# Patient Record
Sex: Female | Born: 1980
Health system: Southern US, Community
[De-identification: ages and names within clinical notes are randomized; demographics above are authoritative.]

## PROBLEM LIST (undated history)

## (undated) ENCOUNTER — Emergency Department (HOSPITAL_COMMUNITY): Payer: BLUE CROSS/BLUE SHIELD | Source: Home / Self Care

## (undated) DIAGNOSIS — B9689 Other specified bacterial agents as the cause of diseases classified elsewhere: Secondary | ICD-10-CM

## (undated) DIAGNOSIS — N159 Renal tubulo-interstitial disease, unspecified: Secondary | ICD-10-CM

## (undated) DIAGNOSIS — I4721 Torsades de pointes: Secondary | ICD-10-CM

## (undated) DIAGNOSIS — I471 Supraventricular tachycardia, unspecified: Secondary | ICD-10-CM

## (undated) DIAGNOSIS — N760 Acute vaginitis: Secondary | ICD-10-CM

## (undated) DIAGNOSIS — N390 Urinary tract infection, site not specified: Secondary | ICD-10-CM

---

## 2009-04-23 ENCOUNTER — Emergency Department (HOSPITAL_COMMUNITY): Admission: EM | Admit: 2009-04-23 | Discharge: 2009-04-23 | Payer: Self-pay | Admitting: Emergency Medicine

## 2011-03-09 LAB — URINALYSIS, ROUTINE W REFLEX MICROSCOPIC
Bilirubin Urine: NEGATIVE
Nitrite: NEGATIVE
Protein, ur: NEGATIVE mg/dL
Urobilinogen, UA: 0.2 mg/dL (ref 0.0–1.0)
pH: 6 (ref 5.0–8.0)

## 2011-03-09 LAB — PREGNANCY, URINE: Preg Test, Ur: NEGATIVE

## 2011-11-01 ENCOUNTER — Emergency Department (HOSPITAL_COMMUNITY)
Admission: EM | Admit: 2011-11-01 | Discharge: 2011-11-01 | Disposition: A | Discharge status: Home / Self Care | Payer: Self-pay | Attending: Emergency Medicine | Admitting: Emergency Medicine

## 2011-11-01 ENCOUNTER — Encounter: Payer: Self-pay | Admitting: *Deleted

## 2011-11-01 DIAGNOSIS — N390 Urinary tract infection, site not specified: Secondary | ICD-10-CM | POA: Insufficient documentation

## 2011-11-01 DIAGNOSIS — J329 Chronic sinusitis, unspecified: Secondary | ICD-10-CM | POA: Insufficient documentation

## 2011-11-01 LAB — URINALYSIS, ROUTINE W REFLEX MICROSCOPIC
Bilirubin Urine: NEGATIVE
Hgb urine dipstick: NEGATIVE
Ketones, ur: NEGATIVE mg/dL
Nitrite: POSITIVE — AB
Protein, ur: NEGATIVE mg/dL
Urobilinogen, UA: 0.2 mg/dL (ref 0.0–1.0)

## 2011-11-01 LAB — POCT I-STAT, CHEM 8
BUN: 10 mg/dL (ref 6–23)
Calcium, Ion: 1.22 mmol/L (ref 1.12–1.32)
Creatinine, Ser: 0.8 mg/dL (ref 0.50–1.10)
Glucose, Bld: 88 mg/dL (ref 70–99)
Hemoglobin: 15 g/dL (ref 12.0–15.0)
Sodium: 143 mEq/L (ref 135–145)
TCO2: 28 mmol/L (ref 0–100)

## 2011-11-01 LAB — URINE MICROSCOPIC-ADD ON

## 2011-11-01 MED ORDER — ACETAMINOPHEN 500 MG PO TABS
1000.0000 mg | ORAL_TABLET | Freq: Once | ORAL | Status: AC
  Administered 2011-11-01: 1000 mg via ORAL
  Filled 2011-11-01: qty 2

## 2011-11-01 MED ORDER — CEPHALEXIN 500 MG PO CAPS: 500.0000 mg | ORAL_CAPSULE | Freq: Four times a day (QID) | ORAL | Status: AC

## 2011-11-01 NOTE — ED Provider Notes (Signed)
History     CSN: [id number] Arrival date & time: 11/01/2011  2:32 PM       Chief Complaint  Patient presents with  . Dizziness   HPI Pt was seen at 1500.  Per pt, c/o gradual onset and persistence of constant frontal sinus "pressure," nasal congestion, generalized fatigue and "lightheadedness" x2 days.  Denies fevers, no CP/SOB, no cough, no rash, no sore throat, no abd pain, no N/V/D, no back pain.     History reviewed. No pertinent past medical history.  History reviewed. No pertinent past surgical history.   History  Substance Use Topics  . Smoking status: Never Smoker   . Smokeless tobacco: Not on file  . Alcohol Use: No    Review of Systems ROS: Statement: All systems negative except as marked or noted in the HPI; Constitutional: Negative for fever and chills. ; ; Eyes: Negative for eye pain, redness and discharge. ; ; ENMT: Negative for ear pain, hoarseness, and sore throat. +nasal congestion, sinus pressure; ; Cardiovascular: Negative for chest pain, palpitations, diaphoresis, dyspnea and peripheral edema. ; ; Respiratory: Negative for cough, wheezing and stridor. ; ; Gastrointestinal: Negative for nausea, vomiting, diarrhea and abdominal pain, blood in stool, hematemesis, jaundice and rectal bleeding. . ; ; Genitourinary: Negative for dysuria, flank pain and hematuria. ; ; Musculoskeletal: Negative for back pain and neck pain. Negative for swelling and trauma.; ; Skin: Negative for pruritus, rash, abrasions, blisters, bruising and skin lesion.; ; Neuro: +lightheadedness, generalized fatigue. Negative for headache, and neck stiffness. Negative for altered level of consciousness , altered mental status, extremity weakness, paresthesias, involuntary movement, seizure and syncope.     Allergies  Aspirin  Home Medications   Current Outpatient Rx  Name Route Sig Dispense Refill  . IBUPROFEN 200 MG PO TABS Oral Take 200 mg by mouth every 6 (six) hours as needed. For pain     .  LORAZEPAM 0.5 MG PO TABS Oral Take 0.25-0.5 mg by mouth every 8 (eight) hours as needed. For anxiety     . THERA M PLUS PO TABS Oral Take 1 tablet by mouth daily.        BP 142/83  Pulse 79  Temp(Src) 97.9 F (36.6 C) (Oral)  Resp 16  Ht 5\' 4"  (1.626 m)  Wt 167 lb (75.751 kg)  BMI 28.67 kg/m2  SpO2 100%  LMP 10/21/2011  Physical Exam 1505: Physical examination:  Nursing notes reviewed; Vital signs and O2 SAT reviewed;  Constitutional: Well developed, Well nourished, Well hydrated, In no acute distress; Head:  Normocephalic, atraumatic; Eyes: EOMI, PERRL, No scleral icterus; ENMT: TM's clear bilat, clear fluid levels behind TM's bilat.  +edemetous nasal turbinates bilat with clear rhinorrhea. +frontal sinus tenderness to percuss. Mouth and pharynx normal, Mucous membranes moist; Neck: Supple, Full range of motion, No lymphadenopathy; Cardiovascular: Regular rate and rhythm, No murmur, rub, or gallop; Respiratory: Breath sounds clear & equal bilaterally, No rales, rhonchi, wheezes, or rub, Normal respiratory effort/excursion; Chest: Nontender, Movement normal; Abdomen: Soft, Nontender, Nondistended, Normal bowel sounds; Genitourinary: No CVA tenderness; Extremities: Pulses normal, No tenderness, No edema, No calf edema or asymmetry.; Neuro: AA&Ox3, Major CN grossly intact. Speech clear, no facial droop.  No gross focal motor or sensory deficits in extremities.; Skin: Color normal, Warm, Dry, no rash, no petechiae.   ED Course  Procedures    MDM  MDM Reviewed: nursing note and vitals Interpretation: labs   Results for orders placed during the hospital encounter of 11/01/11  URINALYSIS, ROUTINE W REFLEX MICROSCOPIC      Component Value Range   Color, Urine YELLOW  YELLOW    APPearance CLEAR  CLEAR    Specific Gravity, Urine 1.020  1.005 - 1.030    pH 7.0  5.0 - 8.0    Glucose, UA NEGATIVE  NEGATIVE (mg/dL)   Hgb urine dipstick NEGATIVE  NEGATIVE    Bilirubin Urine NEGATIVE   NEGATIVE    Ketones, ur NEGATIVE  NEGATIVE (mg/dL)   Protein, ur NEGATIVE  NEGATIVE (mg/dL)   Urobilinogen, UA 0.2  0.0 - 1.0 (mg/dL)   Nitrite POSITIVE (*) NEGATIVE    Leukocytes, UA NEGATIVE  NEGATIVE   POCT PREGNANCY, URINE      Component Value Range   Preg Test, Ur NEGATIVE    POCT I-STAT, CHEM 8      Component Value Range   Sodium 143  135 - 145 (mEq/L)   Potassium 3.9  3.5 - 5.1 (mEq/L)   Chloride 104  96 - 112 (mEq/L)   BUN 10  6 - 23 (mg/dL)   Creatinine, Ser 4.09  0.50 - 1.10 (mg/dL)   Glucose, Bld 88  70 - 99 (mg/dL)   Calcium, Ion 8.11  9.14 - 1.32 (mmol/L)   TCO2 28  0 - 100 (mmol/L)   Hemoglobin 15.0  12.0 - 15.0 (g/dL)   HCT 78.2  95.6 - 21.3 (%)  URINE MICROSCOPIC-ADD ON      Component Value Range   Squamous Epithelial / LPF FEW (*) RARE    WBC, UA 3-6  <3 (WBC/hpf)   Bacteria, UA MANY (*) RARE      5:23 PM:  Pt not orthostatic.  VSS while in ED.  Neuro exam intact.  Wants to go home now.  Will tx for UTI.  Dx testing d/w pt and family.  Questions answered.  Verb understanding, agreeable to d/c home with outpt f/u.     Loleta Frommelt Allison Quarry, DO 11/03/11 0105

## 2011-11-01 NOTE — ED Notes (Signed)
Pt reports experiencing intermittent dizziness x 2 weeks. Pt reports dizziness becoming progressively worse since yesterday. Pt denies any nausea or vomiting. Pt states that dizziness is improved when laying down. Pt complains of headache 6/10. Breath sounds clear through out. Positive bowel sounds in all four quadrants.

## 2011-11-01 NOTE — ED Notes (Signed)
Pt states headache and dizziness began at ~1500 yesterday.  Denies N/V

## 2012-04-14 ENCOUNTER — Emergency Department (HOSPITAL_COMMUNITY)
Admission: EM | Admit: 2012-04-14 | Discharge: 2012-04-14 | Disposition: A | Discharge status: Home / Self Care | Payer: 59 | Source: Home / Self Care | Attending: Emergency Medicine | Admitting: Emergency Medicine

## 2012-04-14 ENCOUNTER — Encounter (HOSPITAL_COMMUNITY): Payer: Self-pay | Admitting: Emergency Medicine

## 2012-04-14 ENCOUNTER — Emergency Department (HOSPITAL_COMMUNITY)
Admission: EM | Admit: 2012-04-14 | Discharge: 2012-04-14 | Disposition: A | Discharge status: Home / Self Care | Payer: Self-pay | Source: Home / Self Care

## 2012-04-14 DIAGNOSIS — W57XXXA Bitten or stung by nonvenomous insect and other nonvenomous arthropods, initial encounter: Secondary | ICD-10-CM

## 2012-04-14 DIAGNOSIS — T148 Other injury of unspecified body region: Secondary | ICD-10-CM

## 2012-04-14 DIAGNOSIS — T148XXA Other injury of unspecified body region, initial encounter: Secondary | ICD-10-CM

## 2012-04-14 MED ORDER — DOXYCYCLINE HYCLATE 100 MG PO CAPS: 100.0000 mg | ORAL_CAPSULE | Freq: Two times a day (BID) | ORAL | Status: AC

## 2012-04-14 MED ORDER — DOXYCYCLINE HYCLATE 100 MG PO CAPS: 100.0000 mg | ORAL_CAPSULE | Freq: Two times a day (BID) | ORAL | Status: DC

## 2012-04-14 NOTE — ED Notes (Signed)
Pt has been bitten by 4 ticks in the past 3 days. Pt states she now has severe fatigue and slight pain at the site. Pt states she had night sweats last night and has nausea.

## 2012-04-14 NOTE — Discharge Instructions (Signed)
S. discuss if you get to develop any other symptoms that we discussed theoretically could return in about 2-3 weeks of a tests and see if you were actually supposed this illness in the meantime based on the symptoms that you have described today I consider is reasonable to  take this antibiotic

## 2012-04-14 NOTE — ED Provider Notes (Signed)
History     CSN: [id number]  Arrival date & time 04/14/12  1049   First MD Initiated Contact with Patient 04/14/12 1101      Chief Complaint  Patient presents with  . Fatigue    (Consider location/radiation/quality/duration/timing/severity/associated sxs/prior treatment) HPI Comments: Patient does describe that yesterday she was about 4-5 different tick bites and has been about 3 days cc removed them. Otherwise today she has been feeling very tired and with body aches and severe headache also experiencing tactile fevers at home and chills (patient does not have a thermometer) patient is also reporting feeling nauseous.  The history is provided by the patient.    History reviewed. No pertinent past medical history.  History reviewed. No pertinent past surgical history.  History reviewed. No pertinent family history.  History  Substance Use Topics  . Smoking status: Never Smoker   . Smokeless tobacco: Not on file  . Alcohol Use: No    OB History    Grav Para Term Preterm Abortions TAB SAB Ect Mult Living                  Review of Systems  Constitutional: Positive for fever, chills, appetite change and fatigue. Negative for activity change.  HENT: Negative for neck pain and neck stiffness.   Eyes: Negative for photophobia and redness.  Respiratory: Positive for shortness of breath. Negative for cough.   Skin: Positive for rash.  Neurological: Positive for headaches. Negative for dizziness, seizures and numbness.    Allergies  Aspirin  Home Medications   Current Outpatient Rx  Name Route Sig Dispense Refill  . IBUPROFEN 200 MG PO TABS Oral Take 200 mg by mouth every 6 (six) hours as needed. For pain     . DOXYCYCLINE HYCLATE 100 MG PO CAPS Oral Take 1 capsule (100 mg total) by mouth 2 (two) times daily. 20 capsule 0  . LORAZEPAM 0.5 MG PO TABS Oral Take 0.25-0.5 mg by mouth every 8 (eight) hours as needed. For anxiety     . THERA M PLUS PO TABS Oral Take 1  tablet by mouth daily.        BP 136/71  Pulse 77  Temp(Src) 98.9 F (37.2 C) (Oral)  Resp 16  SpO2 100%  LMP 04/03/2012  Physical Exam  Nursing note and vitals reviewed. Constitutional: She appears well-developed and well-nourished.  HENT:  Head: Normocephalic.  Eyes: Conjunctivae are normal.  Neck: Neck supple.  Pulmonary/Chest: Effort normal.  Skin: Rash noted.       ED Course  Procedures (including critical care time)  Labs Reviewed - No data to display No results found.   1. Tick bite       MDM   Patient with a recent history of multiple tick bites with multiple symptoms. Patient is reporting tactile fevers, headaches body aches nausea. He describes a patient that was unlikely but given the metallic the rate of this potential infection it was reasonable to start her on doxycycline. Have encouraged her to return if worsening symptoms or in about 2 weeks if symptoms resolve if she wanted to be tested for several logic all compression. Has exposure have occurred about 3-4 days with discuss are probably testing her today would have been medically necessary       Debra Molly, MD 04/14/12 1203

## 2012-08-05 ENCOUNTER — Encounter (HOSPITAL_COMMUNITY): Payer: Self-pay | Admitting: *Deleted

## 2012-08-05 DIAGNOSIS — Z888 Allergy status to other drugs, medicaments and biological substances status: Secondary | ICD-10-CM | POA: Insufficient documentation

## 2012-08-05 DIAGNOSIS — N3090 Cystitis, unspecified without hematuria: Secondary | ICD-10-CM | POA: Insufficient documentation

## 2012-08-05 NOTE — ED Notes (Signed)
Pt states that she has a history of kidney infections. Pt states that she has been having pressure when she pees, but states she came in tonight due to having blood in her urine. Pt states she took an old cipro from her last kidney infection. Pt states pain does travel to her right side and back. Pt alert and oriented, pt ambulatory.

## 2012-08-06 ENCOUNTER — Emergency Department (HOSPITAL_COMMUNITY)
Admission: EM | Admit: 2012-08-06 | Discharge: 2012-08-06 | Disposition: A | Discharge status: Home / Self Care | Payer: 59 | Attending: Emergency Medicine | Admitting: Emergency Medicine

## 2012-08-06 DIAGNOSIS — N3090 Cystitis, unspecified without hematuria: Secondary | ICD-10-CM

## 2012-08-06 HISTORY — DX: Renal tubulo-interstitial disease, unspecified: N15.9

## 2012-08-06 LAB — CBC WITH DIFFERENTIAL/PLATELET
Basophils Absolute: 0 10*3/uL (ref 0.0–0.1)
Basophils Relative: 0 % (ref 0–1)
Eosinophils Relative: 2 % (ref 0–5)
HCT: 40.2 % (ref 36.0–46.0)
MCH: 31.1 pg (ref 26.0–34.0)
MCHC: 34.1 g/dL (ref 30.0–36.0)
MCV: 91.4 fL (ref 78.0–100.0)
Monocytes Absolute: 0.5 10*3/uL (ref 0.1–1.0)
Monocytes Relative: 8 % (ref 3–12)
RDW: 12.3 % (ref 11.5–15.5)

## 2012-08-06 LAB — URINE MICROSCOPIC-ADD ON

## 2012-08-06 LAB — URINALYSIS, ROUTINE W REFLEX MICROSCOPIC
Bilirubin Urine: NEGATIVE
Glucose, UA: NEGATIVE mg/dL
Ketones, ur: NEGATIVE mg/dL
Protein, ur: 30 mg/dL — AB

## 2012-08-06 LAB — COMPREHENSIVE METABOLIC PANEL
AST: 25 U/L (ref 0–37)
Albumin: 4.2 g/dL (ref 3.5–5.2)
BUN: 15 mg/dL (ref 6–23)
Calcium: 9.5 mg/dL (ref 8.4–10.5)
Creatinine, Ser: 0.82 mg/dL (ref 0.50–1.10)
GFR calc Af Amer: >90 mL/min (ref >90)
GFR calc non Af Amer: >90 mL/min (ref >90)

## 2012-08-06 MED ORDER — OXYCODONE-ACETAMINOPHEN 5-325 MG PO TABS
1.0000 | ORAL_TABLET | Freq: Once | ORAL | Status: AC
  Administered 2012-08-06: 1 via ORAL
  Filled 2012-08-06: qty 1

## 2012-08-06 MED ORDER — SULFAMETHOXAZOLE-TMP DS 800-160 MG PO TABS
1.0000 | ORAL_TABLET | Freq: Once | ORAL | Status: AC
  Administered 2012-08-06: 1 via ORAL
  Filled 2012-08-06: qty 1

## 2012-08-06 MED ORDER — SULFAMETHOXAZOLE-TRIMETHOPRIM 800-160 MG PO TABS: 1.0000 | ORAL_TABLET | Freq: Two times a day (BID) | ORAL | Status: AC

## 2012-08-06 MED ORDER — PHENAZOPYRIDINE HCL 200 MG PO TABS: 200.0000 mg | ORAL_TABLET | Freq: Three times a day (TID) | ORAL | Status: AC | PRN

## 2012-08-06 NOTE — ED Notes (Signed)
Prescriptions given x2 with discharge papers.

## 2012-08-06 NOTE — ED Provider Notes (Addendum)
History     CSN: [id number]  Arrival date & time 08/05/12  2319   First MD Initiated Contact with Patient 08/06/12 0155      Chief Complaint  Patient presents with  . Hematuria    (Consider location/radiation/quality/duration/timing/severity/associated sxs/prior treatment) Patient is a 31 y.o. female presenting with hematuria and dysuria. The history is provided by the patient. No language interpreter was used.  Hematuria This is a new problem. The current episode started yesterday. The problem is unchanged. She describes the hematuria as gross hematuria. The hematuria occurs throughout her entire urinary stream. She reports no clotting in her urine stream. Her pain is at a severity of 8/10. The pain is severe. She describes her urine color as light pink. Irritative symptoms include frequency and urgency. Obstructive symptoms include a slower stream. Associated symptoms include dysuria and hesitancy. Pertinent negatives include no facial swelling, fever, flank pain, inability to urinate, nausea or vomiting. Her sexual activity is non-contributory to the current illness. Her past medical history is significant for hypertension. There is no history of kidney stones.  Dysuria  This is a recurrent problem. The current episode started yesterday. The problem occurs every urination. The problem has not changed since onset.The quality of the pain is described as burning. The pain is at a severity of 8/10. The pain is severe. There has been no fever. There is no history of pyelonephritis. Associated symptoms include frequency, hematuria, hesitancy and urgency. Pertinent negatives include no nausea, no vomiting and no flank pain. She has tried nothing for the symptoms. Her past medical history is significant for recurrent UTIs. Her past medical history does not include kidney stones.    Past Medical History  Diagnosis Date  . Kidney infection     History reviewed. No pertinent past surgical  history.  History reviewed. No pertinent family history.  History  Substance Use Topics  . Smoking status: Never Smoker   . Smokeless tobacco: Not on file  . Alcohol Use: Yes    OB History    Grav Para Term Preterm Abortions TAB SAB Ect Mult Living                  Review of Systems  Constitutional: Negative for fever.  HENT: Negative for facial swelling.   Gastrointestinal: Negative for nausea and vomiting.  Genitourinary: Positive for dysuria, hesitancy, urgency, frequency and hematuria. Negative for flank pain.  All other systems reviewed and are negative.    Allergies  Aspirin  Home Medications   Current Outpatient Rx  Name Route Sig Dispense Refill  . CIPROFLOXACIN HCL 500 MG PO TABS Oral Take 250 mg by mouth once.    Marland Kitchen DIPHENHYDRAMINE HCL 25 MG PO CAPS Oral Take 25 mg by mouth at bedtime as needed. sleep      BP 123/74  Pulse 78  Temp 98 F (36.7 C) (Oral)  Resp 20  SpO2 97%  Physical Exam  Constitutional: She is oriented to person, place, and time. She appears well-developed and well-nourished. No distress.  HENT:  Head: Normocephalic and atraumatic.  Mouth/Throat: Oropharynx is clear and moist.  Eyes: Conjunctivae are normal. Pupils are equal, round, and reactive to light.  Neck: Normal range of motion. Neck supple.  Cardiovascular: Normal rate and regular rhythm.   Pulmonary/Chest: Effort normal and breath sounds normal. She has no wheezes. She has no rales.  Abdominal: Soft. Bowel sounds are normal. There is no tenderness. There is no rebound and no guarding.  Musculoskeletal: Normal  range of motion.  Neurological: She is alert and oriented to person, place, and time.  Skin: Skin is warm.  Psychiatric: She has a normal mood and affect.    ED Course  Procedures (including critical care time)  Labs Reviewed  URINALYSIS, ROUTINE W REFLEX MICROSCOPIC - Abnormal; Notable for the following:    APPearance CLOUDY (*)     Hgb urine dipstick LARGE (*)      Protein, ur 30 (*)     Leukocytes, UA MODERATE (*)     All other components within normal limits  URINE MICROSCOPIC-ADD ON - Abnormal; Notable for the following:    Squamous Epithelial / LPF FEW (*)     All other components within normal limits  CBC WITH DIFFERENTIAL  COMPREHENSIVE METABOLIC PANEL  POCT PREGNANCY, URINE   No results found.   No diagnosis found.    MDM  No flank pain.  No vaginal complaints.  Symptoms consistent with cystitis.  Patient states sulf has worked in the past.  Return for fevers flank pain clotting nausea vomiting or any concerns.         Jasmine Awe, MD 08/06/12 0216  Jayant Kriz K Kaulin Chaves-Rasch, MD 08/06/12 [phone]

## 2012-12-24 ENCOUNTER — Emergency Department (HOSPITAL_COMMUNITY)
Admission: EM | Admit: 2012-12-24 | Discharge: 2012-12-24 | Disposition: A | Discharge status: Home / Self Care | Payer: 59 | Source: Home / Self Care

## 2012-12-24 ENCOUNTER — Emergency Department (HOSPITAL_COMMUNITY): Payer: 59

## 2012-12-24 ENCOUNTER — Other Ambulatory Visit (HOSPITAL_COMMUNITY)
Admission: RE | Admit: 2012-12-24 | Discharge: 2012-12-24 | Disposition: A | Discharge status: Home / Self Care | Payer: 59 | Source: Ambulatory Visit | Attending: Family Medicine | Admitting: Family Medicine

## 2012-12-24 ENCOUNTER — Emergency Department (HOSPITAL_COMMUNITY)
Admission: EM | Admit: 2012-12-24 | Discharge: 2012-12-24 | Disposition: A | Discharge status: Home / Self Care | Payer: 59 | Attending: Emergency Medicine | Admitting: Emergency Medicine

## 2012-12-24 ENCOUNTER — Encounter (HOSPITAL_COMMUNITY): Payer: Self-pay | Admitting: *Deleted

## 2012-12-24 ENCOUNTER — Encounter (HOSPITAL_COMMUNITY): Payer: Self-pay

## 2012-12-24 DIAGNOSIS — N72 Inflammatory disease of cervix uteri: Secondary | ICD-10-CM

## 2012-12-24 DIAGNOSIS — N760 Acute vaginitis: Secondary | ICD-10-CM

## 2012-12-24 DIAGNOSIS — R209 Unspecified disturbances of skin sensation: Secondary | ICD-10-CM | POA: Insufficient documentation

## 2012-12-24 DIAGNOSIS — R51 Headache: Secondary | ICD-10-CM | POA: Insufficient documentation

## 2012-12-24 DIAGNOSIS — Z113 Encounter for screening for infections with a predominantly sexual mode of transmission: Secondary | ICD-10-CM | POA: Insufficient documentation

## 2012-12-24 DIAGNOSIS — A499 Bacterial infection, unspecified: Secondary | ICD-10-CM

## 2012-12-24 DIAGNOSIS — R202 Paresthesia of skin: Secondary | ICD-10-CM

## 2012-12-24 DIAGNOSIS — N390 Urinary tract infection, site not specified: Secondary | ICD-10-CM | POA: Insufficient documentation

## 2012-12-24 DIAGNOSIS — R0789 Other chest pain: Secondary | ICD-10-CM | POA: Insufficient documentation

## 2012-12-24 LAB — COMPREHENSIVE METABOLIC PANEL
AST: 23 U/L (ref 0–37)
Albumin: 4.3 g/dL (ref 3.5–5.2)
Calcium: 9.4 mg/dL (ref 8.4–10.5)
Creatinine, Ser: 0.69 mg/dL (ref 0.50–1.10)
GFR calc non Af Amer: >90 mL/min (ref >90)

## 2012-12-24 LAB — CBC WITH DIFFERENTIAL/PLATELET
Basophils Absolute: 0 10*3/uL (ref 0.0–0.1)
Basophils Relative: 0 % (ref 0–1)
Eosinophils Relative: 0 % (ref 0–5)
HCT: 41 % (ref 36.0–46.0)
MCHC: 34.6 g/dL (ref 30.0–36.0)
MCV: 90.1 fL (ref 78.0–100.0)
Monocytes Absolute: 0.5 10*3/uL (ref 0.1–1.0)
RDW: 12.3 % (ref 11.5–15.5)

## 2012-12-24 LAB — POCT URINALYSIS DIP (DEVICE)
Ketones, ur: 15 mg/dL — AB
Leukocytes, UA: NEGATIVE
pH: 7 (ref 5.0–8.0)

## 2012-12-24 MED ORDER — CEPHALEXIN 250 MG PO CAPS: 250.0000 mg | ORAL_CAPSULE | Freq: Four times a day (QID) | ORAL | Status: DC

## 2012-12-24 MED ORDER — METRONIDAZOLE 500 MG PO TABS: 500.0000 mg | ORAL_TABLET | Freq: Two times a day (BID) | ORAL | Status: DC

## 2012-12-24 MED ORDER — IBUPROFEN 800 MG PO TABS
800.0000 mg | ORAL_TABLET | Freq: Once | ORAL | Status: AC
  Administered 2012-12-24: 800 mg via ORAL
  Filled 2012-12-24: qty 1

## 2012-12-24 MED ORDER — ACETAMINOPHEN 325 MG PO TABS
650.0000 mg | ORAL_TABLET | Freq: Once | ORAL | Status: AC
  Administered 2012-12-24: 650 mg via ORAL
  Filled 2012-12-24: qty 2

## 2012-12-24 NOTE — ED Notes (Signed)
Pt c/o severe headache and body feeling numb.  Dr. Bernette Mayers notified.  Delay explained to pt.

## 2012-12-24 NOTE — ED Notes (Signed)
Patient complains of dysuria; burning sensation; polyuria with odor to urine x 5 days; also lower back pain x 1 day. Denies nausea, vomiting, fever/chills, diarrhea.

## 2012-12-24 NOTE — ED Provider Notes (Signed)
Medical screening examination/treatment/procedure(s) were performed by resident physician or non-physician practitioner and as supervising physician I was immediately available for consultation/collaboration.   Jakhari Space DOUGLAS MD.    Ankita Newcomer D Shem Plemmons, MD 12/24/12 1631 

## 2012-12-24 NOTE — ED Notes (Signed)
Pt complaining of facial numbness & numbness to both arms. Pt remains on cardiac monitor, VS WNL. Pt states the right side of her head also is hurting.

## 2012-12-24 NOTE — ED Provider Notes (Signed)
History   This chart was scribed for Flint Melter, MD by Leone Payor, ED Scribe. This patient was seen in room [facility]02/[facility]02 and the patient's care was started 7:03 PM.  CSN: [id number]  Arrival date & time 12/24/12  1656   First MD Initiated Contact with Patient 12/24/12 1905      Chief Complaint  Patient presents with  . Panic Attack     The history is provided by the patient. No language interpreter was used.    Debra Ramos is a 32 y.o. female brought in by ambulance, who presents to the Emergency Department complaining of constant, unchanged facial numbness and numbness to both arms and legs starting today. Pt has associated headache. She states she had chest pain in the ambulance but does not currently. Pt was diagnosed with UTI and Bacterial Vaginosis at Urgent Care earlier today. Pt states she has not been eating properly in the last several days due to family problems. She denies chest pain currently, trouble breathing.   Allergic to aspirin.  Pt denies smoking but occasionally uses alcohol.  Past Medical History  Diagnosis Date  . Kidney infection     History reviewed. No pertinent past surgical history.  No family history on file.  History  Substance Use Topics  . Smoking status: Never Smoker   . Smokeless tobacco: Not on file  . Alcohol Use: Yes    No OB history provided.   Review of Systems  A complete 10 system review of systems was obtained and all systems are negative except as noted in the HPI and PMH.    Allergies  Aspirin  Home Medications   Current Outpatient Rx  Name  Route  Sig  Dispense  Refill  . CEPHALEXIN 250 MG PO CAPS   Oral   Take 1 capsule (250 mg total) by mouth 4 (four) times daily.   28 capsule   0   . METRONIDAZOLE 500 MG PO TABS   Oral   Take 1 tablet (500 mg total) by mouth 2 (two) times daily. X 7 days   14 tablet   0     BP 128/91  Pulse 101  Temp 98.8 F (37.1 C) (Oral)  Resp 20  Ht 5\' 4"  (1.626 m)  Wt 175  lb (79.379 kg)  BMI 30.04 kg/m2  SpO2 100%  LMP 12/05/2012  Physical Exam  Nursing note and vitals reviewed. Constitutional: She is oriented to person, place, and time. She appears well-developed and well-nourished.  HENT:  Head: Normocephalic and atraumatic.  Eyes: Conjunctivae normal and EOM are normal. Pupils are equal, round, and reactive to light.  Neck: Normal range of motion and phonation normal. Neck supple.       No meningismus.   Cardiovascular: Normal rate, regular rhythm and intact distal pulses.   Pulmonary/Chest: Effort normal and breath sounds normal. She exhibits no tenderness.  Abdominal: Soft. She exhibits no distension. There is no tenderness. There is no guarding.  Musculoskeletal: Normal range of motion.  Neurological: She is alert and oriented to person, place, and time. She has normal strength. She exhibits normal muscle tone.  Skin: Skin is warm and dry.  Psychiatric: She has a normal mood and affect. Her behavior is normal. Judgment and thought content normal.    ED Course  Procedures (including critical care time)  DIAGNOSTIC STUDIES: Oxygen Saturation is 100% on room air, normal by my interpretation.    COORDINATION OF CARE:  7:12 PM Discussed treatment plan  which includes CXR, CT scan of head, CBC panel, comprehensive metabolic panel, troponin I with pt at bedside and pt agreed to plan.    8:56 PM Advised pt of radiology and lab work results. Discussed discharge plan with pt and pt agreed to plan. Also advised pt to follow up and pt agreed.       Date: 09/15/2012  Rate: 78  Rhythm: normal sinus rhythm and sinus arrhythmia  QRS Axis: normal  PR and QT Intervals: normal  ST/T Wave abnormalities: normal  PR and QRS Conduction Disutrbances:none  Narrative Interpretation:   Old EKG Reviewed: none available     Results for orders placed during the hospital encounter of 12/24/12  CBC WITH DIFFERENTIAL      Component Value Range   WBC 6.2  4.0  - 10.5 K/uL   RBC 4.55  3.87 - 5.11 MIL/uL   Hemoglobin 14.2  12.0 - 15.0 g/dL   HCT 16.1  09.6 - 04.5 %   MCV 90.1  78.0 - 100.0 fL   MCH 31.2  26.0 - 34.0 pg   MCHC 34.6  30.0 - 36.0 g/dL   RDW 40.9  81.1 - 91.4 %   Platelets 217  150 - 400 K/uL   Neutrophils Relative 73  43 - 77 %   Neutro Abs 4.5  1.7 - 7.7 K/uL   Lymphocytes Relative 19  12 - 46 %   Lymphs Abs 1.2  0.7 - 4.0 K/uL   Monocytes Relative 8  3 - 12 %   Monocytes Absolute 0.5  0.1 - 1.0 K/uL   Eosinophils Relative 0  0 - 5 %   Eosinophils Absolute 0.0  0.0 - 0.7 K/uL   Basophils Relative 0  0 - 1 %   Basophils Absolute 0.0  0.0 - 0.1 K/uL  COMPREHENSIVE METABOLIC PANEL      Component Value Range   Sodium 135  135 - 145 mEq/L   Potassium 3.5  3.5 - 5.1 mEq/L   Chloride 100  96 - 112 mEq/L   CO2 25  19 - 32 mEq/L   Glucose, Bld 90  70 - 99 mg/dL   BUN 12  6 - 23 mg/dL   Creatinine, Ser 7.82  0.50 - 1.10 mg/dL   Calcium 9.4  8.4 - 95.6 mg/dL   Total Protein 7.4  6.0 - 8.3 g/dL   Albumin 4.3  3.5 - 5.2 g/dL   AST 23  0 - 37 U/L   ALT 18  0 - 35 U/L   Alkaline Phosphatase 67  39 - 117 U/L   Total Bilirubin 0.6  0.3 - 1.2 mg/dL   GFR calc non Af Amer >90  >90 mL/min   GFR calc Af Amer >90  >90 mL/min  TROPONIN I      Component Value Range   Troponin I <0.30  <0.30 ng/mL   Dg Chest 2 View  12/24/2012  *RADIOLOGY REPORT*  Clinical Data: Dizziness.  Weakness.  Numbness.  CHEST - 2 VIEW  Comparison: None.  Findings: Cardiac and mediastinal contours appear normal.  The lungs appear clear.  No pleural effusion is identified.  IMPRESSION:  No significant abnormality identified.   Original Report Authenticated By: Gaylyn Rong, M.D.    Ct Head Wo Contrast  12/24/2012  *RADIOLOGY REPORT*  Clinical Data: Facial numbness.  Panic attack.  CT HEAD WITHOUT CONTRAST  Technique:  Contiguous axial images were obtained from the base of the skull through the vertex  without contrast.  Comparison: None.  Findings: The brain  appears normal without infarct, hemorrhage, mass lesion, mass effect, midline shift or abnormal extra-axial fluid collection.  No pneumocephalus or hydrocephalus.  Calvarium intact.  Small mucous retention cyst or polyp right maxillary sinus noted.  IMPRESSION: No acute finding.   Original Report Authenticated By: Holley Dexter, M.D.     Nursing notes, applicable records and vitals reviewed.  Radiologic Images/Reports reviewed.    1. Paresthesia   2. Headache       MDM  Nonspecific, paresthesias, headache, and visual symptoms. Doubt CVA, meningitis, encephalitis. She has significant stress in her family and may be having a emotional reaction. Doubt metabolic instability, serious bacterial infection or impending vascular collapse; the patient is stable for discharge.      I personally performed the services described in this documentation, which was scribed in my presence. The recorded information has been reviewed and is accurate.      Plan: Home Medications- take medicines, as prescribed, earlier today; Home Treatments- rest; Recommended follow up- PCP, when necessary    Flint Melter, MD 12/25/12 [phone]

## 2012-12-24 NOTE — ED Notes (Signed)
Pt alert & oriented x4. Parent given discharge instructions, paperwork & prescription(s). Patient instructed to stop at the registration desk to finish any additional paperwork. Patient verbalized understanding. Pt left department w/ no further questions.

## 2012-12-24 NOTE — ED Notes (Addendum)
Pt via EMS called for Respiratory distress. C/o chest pressure non-radiating. Pt just discharged from urgent care with diagnoses of UTI, and Bacterial Vaginosis. Pt received call from mother stating she wanted to kill herself then the symptoms started. Denies Chest pain at present but does have right sided headache.

## 2012-12-24 NOTE — ED Provider Notes (Signed)
History     CSN: [id number]  Arrival date & time 12/24/12  1145   First MD Initiated Contact with Patient 12/24/12 1401      Chief Complaint  Patient presents with  . Urinary Tract Infection    (Consider location/radiation/quality/duration/timing/severity/associated sxs/prior treatment) HPI Comments: 32 year old female states she has had 5 days of dysuria. She has been aching across her lower pelvis particularly in the supra pubic area. Is also having mild bilateral back pain and urinary frequency. She denies fevers at home and bleeding. Initially she denied having a vaginal discharge but later states she does have an abnormal discharge along with a foul odor. She states the last time she had the symptoms she also had BV associated with her UTI. LMP 12/05/12. For the end of the exam the patient tells me that this month she has not been using any contraception and if she gets pregnant then that is okay with her family planning.   Past Medical History  Diagnosis Date  . Kidney infection     History reviewed. No pertinent past surgical history.  No family history on file.  History  Substance Use Topics  . Smoking status: Never Smoker   . Smokeless tobacco: Not on file  . Alcohol Use: Yes    OB History    Grav Para Term Preterm Abortions TAB SAB Ect Mult Living                  Review of Systems  Constitutional: Negative.   Respiratory: Negative.   Cardiovascular: Negative.   Gastrointestinal: Negative.   Genitourinary: Positive for dysuria, frequency, flank pain, vaginal discharge and pelvic pain. Negative for vaginal bleeding, difficulty urinating and menstrual problem.  Musculoskeletal: Negative.   Psychiatric/Behavioral: Negative.     Allergies  Aspirin  Home Medications   Current Outpatient Rx  Name  Route  Sig  Dispense  Refill  . CEPHALEXIN 250 MG PO CAPS   Oral   Take 1 capsule (250 mg total) by mouth 4 (four) times daily.   28 capsule   0   .  CIPROFLOXACIN HCL 500 MG PO TABS   Oral   Take 250 mg by mouth once.         Marland Kitchen DIPHENHYDRAMINE HCL 25 MG PO CAPS   Oral   Take 25 mg by mouth at bedtime as needed. sleep         . METRONIDAZOLE 500 MG PO TABS   Oral   Take 1 tablet (500 mg total) by mouth 2 (two) times daily. X 7 days   14 tablet   0     BP 124/81  Pulse 83  Temp 99.1 F (37.3 C) (Oral)  Resp 18  SpO2 100%  LMP 12/05/2012  Physical Exam  Nursing note and vitals reviewed. Constitutional: She is oriented to person, place, and time. She appears well-developed and well-nourished. No distress.  HENT:  Head: Normocephalic and atraumatic.  Mouth/Throat: No oropharyngeal exudate.  Eyes: EOM are normal. Pupils are equal, round, and reactive to light.  Neck: Normal range of motion. Neck supple.  Cardiovascular: Normal rate and normal heart sounds.   Pulmonary/Chest: Effort normal and breath sounds normal. No respiratory distress.  Abdominal: Soft. Bowel sounds are normal. She exhibits no mass. There is no tenderness. There is no rebound and no guarding.  Genitourinary:       External pelvic exam reveals tenderness over the suprapubis. There is no bilateral pelvic tenderness. Normal external female genitalia  and without lesions. Vagina and vaginal vault with copious amount of thin pale-like green to white bubbly discharge. This also notes the cervix. Cervix is midline and mildly erythemic. Bimanual positive for CMT and adnexal tenderness.  Musculoskeletal: Normal range of motion.  Neurological: She is alert and oriented to person, place, and time. No cranial nerve deficit.  Skin: Skin is warm and dry.  Psychiatric: She has a normal mood and affect.    ED Course  Procedures (including critical care time)  Labs Reviewed  POCT URINALYSIS DIP (DEVICE) - Abnormal; Notable for the following:    Bilirubin Urine SMALL (*)     Ketones, ur 15 (*)     Protein, ur 30 (*)     All other components within normal  limits  URINE CULTURE  CERVICOVAGINAL ANCILLARY ONLY   No results found.   1. UTI (lower urinary tract infection)   2. Vaginitis   3. Cervicitis   4. BV (bacterial vaginosis)       MDM  Although the urinalysis was negative for nitrites and leukocytes her symptoms were typical of urinary tract infection. In addition she has a vaginal discharge consistent with BV or other vaginitis as well as mild pelvic pain. We will treat her for UTI and BV with the following Keflex 250 mg 4 times a day for 7 days Metronidazole 500 mg twice a day for 7 days Affirm chest were obtained. For any positives that really receive will call and treat accordingly. Any worsening new symptoms or problems may return. Drink plenty of fluids and stay well-hydrated Culture the urine        [name], NP 12/24/12 1436  [name], NP 12/24/12 1438

## 2012-12-25 ENCOUNTER — Encounter (HOSPITAL_COMMUNITY): Payer: Self-pay | Admitting: Emergency Medicine

## 2012-12-25 LAB — POCT PREGNANCY, URINE: Preg Test, Ur: NEGATIVE

## 2012-12-27 LAB — URINE CULTURE

## 2013-01-12 NOTE — ED Notes (Signed)
GC/Chlamydia neg., Affirm: all neg., Urine culture: >100,000 colonies Staph (coagulase neg.)  Pt. treated with Keflex. Vassie Moselle 01/12/2013

## 2013-01-22 ENCOUNTER — Telehealth (HOSPITAL_COMMUNITY): Payer: Self-pay | Admitting: *Deleted

## 2013-01-22 NOTE — ED Notes (Signed)
[name] NP wanted me to call pt. for clinical improvement. I called but voicemail box full and unable to leave a message. Debra Ramos 01/22/2013

## 2013-01-23 ENCOUNTER — Telehealth (HOSPITAL_COMMUNITY): Payer: Self-pay | Admitting: *Deleted

## 2013-01-26 ENCOUNTER — Telehealth (HOSPITAL_COMMUNITY): Payer: Self-pay | Admitting: *Deleted

## 2013-01-26 NOTE — ED Notes (Signed)
Unable to contact pt. by phone.  Letter sent asking pt. to call. Debra Ramos 01/26/2013

## 2013-04-02 ENCOUNTER — Encounter (HOSPITAL_COMMUNITY): Payer: Self-pay

## 2013-04-02 ENCOUNTER — Emergency Department (HOSPITAL_COMMUNITY)
Admission: EM | Admit: 2013-04-02 | Discharge: 2013-04-03 | Disposition: A | Discharge status: Home / Self Care | Payer: 59 | Attending: Emergency Medicine | Admitting: Emergency Medicine

## 2013-04-02 DIAGNOSIS — R209 Unspecified disturbances of skin sensation: Secondary | ICD-10-CM | POA: Insufficient documentation

## 2013-04-02 DIAGNOSIS — R300 Dysuria: Secondary | ICD-10-CM | POA: Insufficient documentation

## 2013-04-02 DIAGNOSIS — K921 Melena: Secondary | ICD-10-CM | POA: Insufficient documentation

## 2013-04-02 DIAGNOSIS — Z79899 Other long term (current) drug therapy: Secondary | ICD-10-CM | POA: Insufficient documentation

## 2013-04-02 DIAGNOSIS — R350 Frequency of micturition: Secondary | ICD-10-CM | POA: Insufficient documentation

## 2013-04-02 DIAGNOSIS — Z8742 Personal history of other diseases of the female genital tract: Secondary | ICD-10-CM | POA: Insufficient documentation

## 2013-04-02 DIAGNOSIS — Z87448 Personal history of other diseases of urinary system: Secondary | ICD-10-CM | POA: Insufficient documentation

## 2013-04-02 DIAGNOSIS — N898 Other specified noninflammatory disorders of vagina: Secondary | ICD-10-CM | POA: Insufficient documentation

## 2013-04-02 DIAGNOSIS — N949 Unspecified condition associated with female genital organs and menstrual cycle: Secondary | ICD-10-CM | POA: Insufficient documentation

## 2013-04-02 DIAGNOSIS — N390 Urinary tract infection, site not specified: Secondary | ICD-10-CM | POA: Insufficient documentation

## 2013-04-02 DIAGNOSIS — R109 Unspecified abdominal pain: Secondary | ICD-10-CM | POA: Insufficient documentation

## 2013-04-02 DIAGNOSIS — Z3202 Encounter for pregnancy test, result negative: Secondary | ICD-10-CM | POA: Insufficient documentation

## 2013-04-02 HISTORY — DX: Urinary tract infection, site not specified: N39.0

## 2013-04-02 HISTORY — DX: Other specified bacterial agents as the cause of diseases classified elsewhere: B96.89

## 2013-04-02 HISTORY — DX: Acute vaginitis: N76.0

## 2013-04-02 LAB — URINE MICROSCOPIC-ADD ON

## 2013-04-02 LAB — POCT PREGNANCY, URINE: Preg Test, Ur: NEGATIVE

## 2013-04-02 LAB — URINALYSIS, ROUTINE W REFLEX MICROSCOPIC
Specific Gravity, Urine: 1.028 (ref 1.005–1.030)
Urobilinogen, UA: 0.2 mg/dL (ref 0.0–1.0)
pH: 6.5 (ref 5.0–8.0)

## 2013-04-02 NOTE — ED Notes (Signed)
Pt also c/o swollen and tender lymph node under her left axilla.

## 2013-04-02 NOTE — ED Notes (Signed)
Pt states was treated for BV at urgent care 1 month ago-states she is having burning on urination since last Friday.  States also having a vaginal discharge.  Pt states her partner was not treated and states she did have unprotected sex with him again.

## 2013-04-02 NOTE — ED Provider Notes (Signed)
History     CSN: [id number]  Arrival date & time 04/02/13  1959   First MD Initiated Contact with Patient 04/02/13 2318      Chief Complaint  Patient presents with  . Urinary Tract Infection    (Consider location/radiation/quality/duration/timing/severity/associated sxs/prior treatment) HPI Comments: Patient is a 32 year old female who presents for dysuria that has been constant and gradually worsening over the last 10 days. Patient also admits to associated pelvic discomfort that is pressure like and non-radiating and white vaginal d/c. Patient denies any aggravating or alleviating factors of her symptoms. She further denies fever, CP, SOB, abdominal pain, N/V/D, hematuria, urinary frequency, melena/hematochezia, and numbness or tingling in her extremities.  Patient is a 32 y.o. female presenting with urinary tract infection. The history is provided by the patient. No language interpreter was used.  Urinary Tract Infection Pertinent negatives include no chest pain, fever, nausea, rash or vomiting.    Past Medical History  Diagnosis Date  . Kidney infection   . Bacterial vaginal infection   . UTI (lower urinary tract infection)     History reviewed. No pertinent past surgical history.  No family history on file.  History  Substance Use Topics  . Smoking status: Never Smoker   . Smokeless tobacco: Not on file  . Alcohol Use: Yes     Comment: on occasion    OB History   Grav Para Term Preterm Abortions TAB SAB Ect Mult Living                  Review of Systems  Constitutional: Negative for fever.  Respiratory: Negative for shortness of breath.   Cardiovascular: Negative for chest pain.  Gastrointestinal: Negative for nausea and vomiting.  Genitourinary: Positive for dysuria, vaginal discharge and pelvic pain. Negative for hematuria, flank pain, vaginal bleeding and difficulty urinating.  Skin: Negative for color change, pallor and rash.  All other systems reviewed  and are negative.    Allergies  Aspirin and Aspirin  Home Medications   Current Outpatient Rx  Name  Route  Sig  Dispense  Refill  . LORazepam (ATIVAN) 0.5 MG tablet   Oral   Take 0.25-0.5 mg by mouth every 8 (eight) hours as needed. For anxiety          . sulfamethoxazole-trimethoprim (BACTRIM DS,SEPTRA DS) 800-160 MG per tablet   Oral   Take 1 tablet by mouth 2 (two) times daily.   14 tablet   0     BP 143/89  Pulse 86  Temp(Src) 99 F (37.2 C) (Oral)  Resp 21  Ht 5\' 4"  (1.626 m)  Wt 178 lb 8 oz (80.967 kg)  BMI 30.62 kg/m2  SpO2 97%  LMP 03/22/2013  Physical Exam  Nursing note and vitals reviewed. Constitutional: She appears well-developed and well-nourished.  HENT:  Head: Normocephalic and atraumatic.  Mouth/Throat: Oropharynx is clear and moist. No oropharyngeal exudate.  Eyes: Conjunctivae and EOM are normal. Pupils are equal, round, and reactive to light. No scleral icterus.  Neck: Normal range of motion. Neck supple.  Cardiovascular: Normal rate, regular rhythm, normal heart sounds and intact distal pulses.   Pulmonary/Chest: Effort normal and breath sounds normal. No respiratory distress. She has no wheezes. She has no rales.  Abdominal: Soft. She exhibits no distension and no mass. There is tenderness in the suprapubic area. There is no rebound, no guarding, no CVA tenderness, no tenderness at McBurney's point and negative Murphy's sign.    Genitourinary: Uterus normal. There  is no rash, tenderness, lesion or injury on the right labia. There is no rash, tenderness, lesion or injury on the left labia. Cervix exhibits no motion tenderness, no discharge and no friability. Right adnexum displays no mass, no tenderness and no fullness. Left adnexum displays no mass, no tenderness and no fullness. No erythema, tenderness or bleeding around the vagina. No foreign body around the vagina. No signs of injury around the vagina. Vaginal discharge found.  White milky  d/c in vaginal vault; no d/c from cervical os appreciated.  Musculoskeletal: Normal range of motion.  Lymphadenopathy:    She has no cervical adenopathy.  Neurological: She is alert.  Skin: Skin is warm and dry. No rash noted. No erythema.  Psychiatric: She has a normal mood and affect. Her behavior is normal.    ED Course  Procedures (including critical care time)  Labs Reviewed  WET PREP, GENITAL - Abnormal; Notable for the following:    WBC, Wet Prep HPF POC FEW (*)    All other components within normal limits  URINALYSIS, ROUTINE W REFLEX MICROSCOPIC - Abnormal; Notable for the following:    APPearance CLOUDY (*)    Hgb urine dipstick TRACE (*)    Leukocytes, UA MODERATE (*)    All other components within normal limits  URINE MICROSCOPIC-ADD ON - Abnormal; Notable for the following:    Bacteria, UA MANY (*)    All other components within normal limits  URINE CULTURE  GC/CHLAMYDIA PROBE AMP  POCT PREGNANCY, URINE   No results found.    1. UTI (urinary tract infection)     MDM  Patient presents for dysuria as been gradually worsening x10 days with associated pelvic discomfort and vaginal discharge. Patient endorses a history of BV which she was last treated for 1 month ago. On physical exam there is mild tenderness appreciated in the suprapubic region without peritoneal signs, palpable masses, or guarding. White milky discharge also appreciated in the vaginal vault with no discharge noted from the cervical os. Patient's urinalysis significant for many bacteria and moderate leukocytes; given lack of squamous cells in urine sample and symptoms of dysuria and pelvic discomfort, will treat for urinary tract infection. Wet prep and GC chlamydia pending.  Wet prep negative for signs of bacterial vaginosis, yeast infection, or Trichomonas. Patient well and nontoxic appearing, in no acute distress, and hemodynamically stable. Appropriate for discharge with primary care follow up and  antibiotics to treat UTI. Followup regarding patient's gonorrhea and Chlamydia culture also advised. Indications for ED return provided.      Antony Madura, PA-C 04/03/13 0131

## 2013-04-03 LAB — WET PREP, GENITAL
Trich, Wet Prep: NONE SEEN
Yeast Wet Prep HPF POC: NONE SEEN

## 2013-04-03 MED ORDER — SULFAMETHOXAZOLE-TRIMETHOPRIM 800-160 MG PO TABS: 1.0000 | ORAL_TABLET | Freq: Two times a day (BID) | ORAL | Status: AC

## 2013-04-03 NOTE — ED Notes (Signed)
Patient is resting comfortably. 

## 2013-04-04 LAB — URINE CULTURE: Colony Count: >=100000

## 2013-04-04 NOTE — ED Provider Notes (Signed)
Medical screening examination/treatment/procedure(s) were performed by non-physician practitioner and as supervising physician I was immediately available for consultation/collaboration.  Ceejay Kegley T Shadie Sweatman, MD 04/04/13 0310 

## 2013-04-05 ENCOUNTER — Telehealth (HOSPITAL_COMMUNITY): Payer: Self-pay | Admitting: Emergency Medicine

## 2013-04-05 NOTE — Progress Notes (Signed)
ED Antimicrobial Stewardship Positive Culture Follow Up   Debra Ramos is an 32 y.o. female who presented to [facility] on 04/02/2013 with a chief complaint of  Chief Complaint  Patient presents with  . Urinary Tract Infection   E.Coli, sensitive to Ceftriaxone, Cefazolin, Ciprofloxacin, Pip/Tazo;  Resistant to Bactrim, intermediate to nitrofurantoin   [x]  Treated with Bactrim, organism resistant to prescribed antimicrobial []  Patient discharged originally without antimicrobial agent and treatment is now indicated  New antibiotic prescription: Keflex 500mg  po QID x 7 days  ED Provider: Raymon Mutton, PA-C   Mickeal Skinner 04/05/2013, 3:38 PM Infectious Diseases Pharmacist Phone# [phone]

## 2013-04-05 NOTE — ED Notes (Signed)
Post ED Visit - Positive Culture Follow-up: Successful Patient Follow-Up  Culture assessed and recommendations reviewed by: []  Wes Dulaney, Pharm.D., BCPS [x]  Celedonio Miyamoto, [address].D., BCPS []  Georgina Pillion, Pharm.D., BCPS []  [place], [address].D., BCPS, AAHIVP []  Estella Husk, Pharm.D., BCPS, AAHIVP  Positive urine culture  []  Patient discharged without antimicrobial prescription and treatment is now indicated [x]  Organism is resistant to prescribed ED discharge antimicrobial []  Patient with positive blood cultures  Changes discussed with ED provider: Raymon Mutton PA-C New antibiotic prescription  Keflex-500 mg QID x & days Called to [facility] in [facility] patient, date 04/05/2013, time 1811  Larena Sox 04/05/2013, 6:06 PM

## 2016-12-07 ENCOUNTER — Emergency Department (HOSPITAL_COMMUNITY): Payer: Self-pay

## 2016-12-07 ENCOUNTER — Encounter (HOSPITAL_COMMUNITY): Payer: Self-pay | Admitting: Emergency Medicine

## 2016-12-07 ENCOUNTER — Emergency Department (HOSPITAL_COMMUNITY)
Admission: EM | Admit: 2016-12-07 | Discharge: 2016-12-07 | Disposition: A | Discharge status: Home / Self Care | Payer: Self-pay | Attending: Emergency Medicine | Admitting: Emergency Medicine

## 2016-12-07 DIAGNOSIS — N390 Urinary tract infection, site not specified: Secondary | ICD-10-CM | POA: Insufficient documentation

## 2016-12-07 DIAGNOSIS — Z79899 Other long term (current) drug therapy: Secondary | ICD-10-CM | POA: Insufficient documentation

## 2016-12-07 LAB — CBC WITH DIFFERENTIAL/PLATELET
Basophils Absolute: 0 10*3/uL (ref 0.0–0.1)
Basophils Relative: 0 %
Eosinophils Absolute: 0.1 10*3/uL (ref 0.0–0.7)
Eosinophils Relative: 3 %
HCT: 41.7 % (ref 36.0–46.0)
Hemoglobin: 14.5 g/dL (ref 12.0–15.0)
Lymphocytes Relative: 16 %
Lymphs Abs: 0.6 10*3/uL — ABNORMAL LOW (ref 0.7–4.0)
MCH: 31.2 pg (ref 26.0–34.0)
MCHC: 34.8 g/dL (ref 30.0–36.0)
MCV: 89.7 fL (ref 78.0–100.0)
Monocytes Absolute: 0.4 10*3/uL (ref 0.1–1.0)
Monocytes Relative: 12 %
Neutro Abs: 2.6 10*3/uL (ref 1.7–7.7)
Neutrophils Relative %: 69 %
Platelets: 183 10*3/uL (ref 150–400)
RBC: 4.65 MIL/uL (ref 3.87–5.11)
RDW: 12.4 % (ref 11.5–15.5)
WBC: 3.7 10*3/uL — ABNORMAL LOW (ref 4.0–10.5)

## 2016-12-07 LAB — URINALYSIS, ROUTINE W REFLEX MICROSCOPIC
Bilirubin Urine: NEGATIVE
Glucose, UA: NEGATIVE mg/dL
Hgb urine dipstick: NEGATIVE
Nitrite: NEGATIVE
Protein, ur: NEGATIVE mg/dL
Specific Gravity, Urine: 1.025 (ref 1.005–1.030)
pH: 5.5 (ref 5.0–8.0)

## 2016-12-07 LAB — BASIC METABOLIC PANEL
Anion gap: 6 (ref 5–15)
BUN: 13 mg/dL (ref 6–20)
CO2: 23 mmol/L (ref 22–32)
Calcium: 8.6 mg/dL — ABNORMAL LOW (ref 8.9–10.3)
Chloride: 105 mmol/L (ref 101–111)
Creatinine, Ser: 0.65 mg/dL (ref 0.44–1.00)
GFR calc Af Amer: >60 mL/min (ref >60)
GFR calc non Af Amer: >60 mL/min (ref >60)
Glucose, Bld: 110 mg/dL — ABNORMAL HIGH (ref 65–99)
Potassium: 3.6 mmol/L (ref 3.5–5.1)
Sodium: 134 mmol/L — ABNORMAL LOW (ref 135–145)

## 2016-12-07 LAB — PREGNANCY, URINE: Preg Test, Ur: NEGATIVE

## 2016-12-07 LAB — URINALYSIS, MICROSCOPIC (REFLEX): RBC / HPF: NONE SEEN RBC/hpf (ref 0–5)

## 2016-12-07 MED ORDER — CEFTRIAXONE SODIUM 1 G IJ SOLR
1.0000 g | Freq: Once | INTRAMUSCULAR | Status: AC
  Administered 2016-12-07: 1 g via INTRAVENOUS
  Filled 2016-12-07: qty 10

## 2016-12-07 MED ORDER — IOPAMIDOL (ISOVUE-300) INJECTION 61%
100.0000 mL | Freq: Once | INTRAVENOUS | Status: AC | PRN
  Administered 2016-12-07: 100 mL via INTRAVENOUS

## 2016-12-07 MED ORDER — IOPAMIDOL (ISOVUE-300) INJECTION 61%
INTRAVENOUS | Status: AC
  Filled 2016-12-07: qty 30

## 2016-12-07 MED ORDER — ACETAMINOPHEN 500 MG PO TABS
1000.0000 mg | ORAL_TABLET | Freq: Once | ORAL | Status: AC
  Administered 2016-12-07: 1000 mg via ORAL
  Filled 2016-12-07: qty 2

## 2016-12-07 MED ORDER — SODIUM CHLORIDE 0.9 % IV BOLUS (SEPSIS)
1000.0000 mL | Freq: Once | INTRAVENOUS | Status: AC
  Administered 2016-12-07: 1000 mL via INTRAVENOUS

## 2016-12-07 MED ORDER — TRAMADOL HCL 50 MG PO TABS: 50.0000 mg | ORAL_TABLET | Freq: Four times a day (QID) | ORAL | 0 refills | Status: DC | PRN

## 2016-12-07 MED ORDER — CEPHALEXIN 500 MG PO CAPS: 500.0000 mg | ORAL_CAPSULE | Freq: Four times a day (QID) | ORAL | 0 refills | Status: DC

## 2016-12-07 NOTE — ED Triage Notes (Signed)
Patient complaining of right lower quadrant pain starting upon awakening this morning. Denies urinary symptoms.

## 2016-12-07 NOTE — ED Provider Notes (Signed)
[facility]-EMERGENCY DEPT Provider Note   CSN: [id number] Arrival date & time: 12/07/16  [id number]   By signing my name below, I, Debra Ramos, attest that this documentation has been prepared under the direction and in the presence of Debra RazorStephen Genesi Stefanko, MD. Electronically Signed: Cynda AcresHailei Ramos, Scribe. 12/07/16. 9:28 AM.  History   Chief Complaint Chief Complaint  Patient presents with  . Abdominal Pain   HPI Comments: Debra Ramos is a 36 y.o. female who presents to the Emergency Department complaining of sudden-onset, constant right lower quadrant pain that began around 7:30 this morning. Patient has associated radiation to the umbilical region, back pain, and chills. She describes the pain as sharp and pressured. Patient states sitting makes the pain worse. She reports sexual intercourse last night. No modifying factors indicated. Patient denies any urinary symptoms, fever, bleeding, discharge, or pelvic discomfort.   The history is provided by the patient. No language interpreter was used.    Past Medical History:  Diagnosis Date  . Bacterial vaginal infection   . Kidney infection   . UTI (lower urinary tract infection)     There are no active problems to display for this patient.   History reviewed. No pertinent surgical history.  OB History    No data available       Home Medications    Prior to Admission medications   Medication Sig Start Date End Date Taking? Authorizing Provider  LORazepam (ATIVAN) 0.5 MG tablet Take 0.25-0.5 mg by mouth every 8 (eight) hours as needed. For anxiety     Historical Provider, MD    Family History History reviewed. No pertinent family history.  Social History Social History  Substance Use Topics  . Smoking status: Never Smoker  . Smokeless tobacco: Never Used  . Alcohol use Yes     Comment: on occasion     Allergies   Aspirin and Aspirin   Review of Systems Review of Systems  Constitutional: Positive for chills. Negative for  fever.  Gastrointestinal: Positive for abdominal pain.  Genitourinary: Negative for difficulty urinating, dysuria and pelvic pain.  Musculoskeletal: Positive for back pain.  All other systems reviewed and are negative.    Physical Exam Updated Vital Signs BP 147/75 (BP Location: Left Arm)   Pulse 100   Temp 98.8 F (37.1 C) (Oral)   Resp 20   Ht 5\' 4"  (1.626 m)   Wt 175 lb (79.4 kg)   LMP 11/06/2016   SpO2 100%   BMI 30.04 kg/m   Physical Exam  Constitutional: She is oriented to person, place, and time. She appears well-developed and well-nourished. No distress.  HENT:  Head: Normocephalic and atraumatic.  Eyes: EOM are normal.  Neck: Normal range of motion.  Cardiovascular: Normal rate, regular rhythm and normal heart sounds.   Pulmonary/Chest: Effort normal and breath sounds normal.  Abdominal: Soft. She exhibits no distension. There is tenderness. There is no rebound and no guarding.  TTP to the RLQ and suprapubically. No rebound or guarding. No CVA tenderness.   Musculoskeletal: Normal range of motion.  Neurological: She is alert and oriented to person, place, and time.  Skin: Skin is warm and dry.  Psychiatric: She has a normal mood and affect. Judgment normal.  Nursing note and vitals reviewed.    ED Treatments / Results  DIAGNOSTIC STUDIES: Oxygen Saturation is 100% on RA, normal by my interpretation.    COORDINATION OF CARE: 9:26 AM Discussed treatment plan with pt at bedside and pt agreed to  plan.  Labs (all labs ordered are listed, but only abnormal results are displayed) Labs Reviewed - No data to display  EKG  EKG Interpretation None       Radiology No results found.   Ct Abdomen Pelvis W Contrast  Result Date: 12/07/2016 CLINICAL DATA:  Acute onset periumbilical and right lower quadrant pain this morning. No known injury. EXAM: CT ABDOMEN AND PELVIS WITH CONTRAST TECHNIQUE: Multidetector CT imaging of the abdomen and pelvis was performed  using the standard protocol following bolus administration of intravenous contrast. CONTRAST:  100 ml ISOVUE-300 IOPAMIDOL (ISOVUE-300) INJECTION 61% COMPARISON:  None. FINDINGS: Lower chest: Lung bases are clear. No pleural or pericardial effusion. Hepatobiliary: 0.9 cm hypoattenuating lesion in the right hepatic lobe on image 17 is likely a cyst. 2-3 punctate calcifications are also seen in the liver. Gallbladder and biliary tree appear normal. Pancreas: Unremarkable. No pancreatic ductal dilatation or surrounding inflammatory changes. Spleen: Normal in size. A few small calcifications are identified in the spleen. Adrenals/Urinary Tract: Adrenal glands are unremarkable. Kidneys are normal, without renal calculi, focal lesion, or hydronephrosis. Bladder is unremarkable. Stomach/Bowel: Stomach is within normal limits. Appendix appears normal. No evidence of bowel wall thickening, distention, or inflammatory changes. Vascular/Lymphatic: No significant vascular findings are present. No enlarged abdominal or pelvic lymph nodes. Reproductive: Uterus and bilateral adnexa are unremarkable. Other: Small fat containing umbilical hernia is seen. Musculoskeletal: Negative. IMPRESSION: Negative for appendicitis. No acute abnormality or finding to explain the patient's symptoms. Small fat containing umbilical hernia. Punctate calcifications in the spleen are most consistent with old granulomatous disease. Electronically Signed   By: Drusilla Kanner M.D.   On: 12/07/2016 11:30   Procedures Procedures (including critical care time)  Medications Ordered in ED Medications - No data to display   Initial Impression / Assessment and Plan / ED Course  I have reviewed the triage vital signs and the nursing notes.  Pertinent labs & imaging results that were available during my care of the patient were reviewed by me and considered in my medical decision making (see chart for details).  Clinical Course    35yF with  abdominal pain. From UTI. CT w/o acute abnormality. Tolerating PO. HD stable. I feel appropriate for outpt tx.   Final Clinical Impressions(s) / ED Diagnoses   Final diagnoses:  Urinary tract infection without hematuria, site unspecified    New Prescriptions New Prescriptions   No medications on file   I personally preformed the services scribed in my presence. The recorded information has been reviewed is accurate. Debra Razor, MD.    Debra Razor, MD 12/10/16 [phone]

## 2016-12-07 NOTE — ED Notes (Signed)
Pt taken to ct 

## 2016-12-10 LAB — URINE CULTURE: Culture: >=100000 — AB

## 2016-12-11 ENCOUNTER — Telehealth: Payer: Self-pay

## 2016-12-11 NOTE — Progress Notes (Signed)
ED Antimicrobial Stewardship Positive Culture Follow Up   Debra Ramos is an 36 y.o. female who [facility] 12[facility]07[facility]2016 with a chief complaint of  Chief Complaint  Patient presents with  . Abdominal Pain    Recent Results (from the past 720 hour(s))  Urine culture     Status: Abnormal   Collection Time: 12/07/16  9:46 AM  Result Value Ref Range Status   Specimen Description URINE, CLEAN CATCH  Final   Special Requests NONE  Final   Culture >=100,000 COLONIES/mL ENTEROCOCCUS FAECALIS (A)  Final   Report Status 12/10/2016 FINAL  Final   Organism ID, Bacteria ENTEROCOCCUS FAECALIS (A)  Final      Susceptibility   Enterococcus faecalis - MIC*    AMPICILLIN <=2 SENSITIVE Sensitive     LEVOFLOXACIN 1 SENSITIVE Sensitive     NITROFURANTOIN <=16 SENSITIVE Sensitive     VANCOMYCIN 2 SENSITIVE Sensitive     * >=100,000 COLONIES/mL ENTEROCOCCUS FAECALIS    [x]  Treated with cephalexin, organism resistant to prescribed antimicrobial  New antibiotic prescription: Call patient and see if she is still having symptoms. If so, stop cephalexin and start nitrofurantoin 100 mg BID x 5 days.  ED Provider: Terance HartKelly Gekas, PA-C  Casilda Carlsaylor Fabiana Dromgoole, PharmD, BCPS PGY-2 Infectious Diseases Pharmacy Resident Pager: [phone] 12/11/2016, 9:07 AM

## 2017-01-11 ENCOUNTER — Telehealth: Payer: Self-pay | Admitting: Emergency Medicine

## 2017-01-11 NOTE — Telephone Encounter (Signed)
Lost to followup 

## 2018-01-21 ENCOUNTER — Emergency Department (HOSPITAL_COMMUNITY): Payer: Self-pay

## 2018-01-21 ENCOUNTER — Encounter (HOSPITAL_COMMUNITY): Payer: Self-pay | Admitting: *Deleted

## 2018-01-21 ENCOUNTER — Emergency Department (HOSPITAL_COMMUNITY)
Admission: EM | Admit: 2018-01-21 | Discharge: 2018-01-21 | Disposition: A | Discharge status: Home / Self Care | Payer: Self-pay | Attending: Emergency Medicine | Admitting: Emergency Medicine

## 2018-01-21 DIAGNOSIS — R062 Wheezing: Secondary | ICD-10-CM | POA: Insufficient documentation

## 2018-01-21 DIAGNOSIS — Z79899 Other long term (current) drug therapy: Secondary | ICD-10-CM | POA: Insufficient documentation

## 2018-01-21 DIAGNOSIS — J111 Influenza due to unidentified influenza virus with other respiratory manifestations: Secondary | ICD-10-CM | POA: Insufficient documentation

## 2018-01-21 DIAGNOSIS — R6889 Other general symptoms and signs: Secondary | ICD-10-CM

## 2018-01-21 DIAGNOSIS — R0602 Shortness of breath: Secondary | ICD-10-CM

## 2018-01-21 MED ORDER — ALBUTEROL SULFATE HFA 108 (90 BASE) MCG/ACT IN AERS
1.0000 | INHALATION_SPRAY | Freq: Once | RESPIRATORY_TRACT | Status: AC
  Administered 2018-01-21: 1 via RESPIRATORY_TRACT
  Filled 2018-01-21: qty 6.7

## 2018-01-21 MED ORDER — PREDNISONE 10 MG PO TABS: 40.0000 mg | ORAL_TABLET | Freq: Every day | ORAL | 0 refills | Status: AC

## 2018-01-21 MED ORDER — IPRATROPIUM BROMIDE 0.02 % IN SOLN: 0.5000 mg | Freq: Once | RESPIRATORY_TRACT | Status: DC

## 2018-01-21 MED ORDER — IPRATROPIUM-ALBUTEROL 0.5-2.5 (3) MG/3ML IN SOLN
3.0000 mL | Freq: Once | RESPIRATORY_TRACT | Status: AC
  Administered 2018-01-21: 3 mL via RESPIRATORY_TRACT
  Filled 2018-01-21: qty 3

## 2018-01-21 MED ORDER — IPRATROPIUM BROMIDE 0.02 % IN SOLN: 1.0000 mg | Freq: Once | RESPIRATORY_TRACT | Status: DC

## 2018-01-21 MED ORDER — PREDNISONE 50 MG PO TABS
60.0000 mg | ORAL_TABLET | Freq: Once | ORAL | Status: AC
  Administered 2018-01-21: 60 mg via ORAL
  Filled 2018-01-21: qty 1

## 2018-01-21 MED ORDER — ALBUTEROL SULFATE (2.5 MG/3ML) 0.083% IN NEBU: 5.0000 mg | INHALATION_SOLUTION | Freq: Once | RESPIRATORY_TRACT | Status: DC

## 2018-01-21 MED ORDER — ALBUTEROL SULFATE (2.5 MG/3ML) 0.083% IN NEBU
2.5000 mg | INHALATION_SOLUTION | Freq: Once | RESPIRATORY_TRACT | Status: AC
  Administered 2018-01-21: 2.5 mg via RESPIRATORY_TRACT
  Filled 2018-01-21: qty 3

## 2018-01-21 NOTE — Discharge Instructions (Addendum)
Drink plenty of water and get plenty of rest.  Take prednisone as prescribed.   You received the first dose in the emergency department today, start taking this tomorrow.  Do not take ibuprofen, Advil, Aleve, or Motrin while on this medicine.  You may take Tylenol every 6 hours, however.  Use albuterol inhaler as needed for shortness of breath every 6 hours.  If you find you are using your albuterol inhaler more frequently than this, return to the ED for further evaluation.  Follow-up with a primary care physician for reevaluation of your symptoms.  Return to the emergency department if if you have any concerning signs or symptoms such as persistent shortness of breath, chest pain, high fevers, or coughing up blood.

## 2018-01-21 NOTE — ED Provider Notes (Signed)
[facility] EMERGENCY DEPARTMENT Provider Note   CSN: [id number] Arrival date & time: 01/21/18  2014     History   Chief Complaint Chief Complaint  Patient presents with  . Shortness of Breath    HPI Debra Ramos is a 37 y.o. female with history of BV and UTI presents for evaluation of 1 week of flulike symptoms and shortness of breath which began yesterday.  She states that everyone in her household except for her husband have had some iteration of the flu including influenza A and influenza B.  She states that she tested positive for influenza A.  She has had symptoms since last Monday.  Symptoms include myalgias, nasal congestion, and cough productive of yellow sputum.  T-max 103 F but fever has resolved over the past several days.  She denies chest pain.  She notes shortness of breath which is intermittent and began yesterday.  She states that she has heard wheezing.  Shortness of breath worsens with cough.  She has tried over-the-counter medications and elderberry with some relief of her symptoms.  She is a non-smoker.  No recent travel or surgeries, no hemoptysis, not on OCPs or estrogen therapy, and no prior history of DVT or PE. The history is provided by the patient.    Past Medical History:  Diagnosis Date  . Bacterial vaginal infection   . Kidney infection   . UTI (lower urinary tract infection)     There are no active problems to display for this patient.   History reviewed. No pertinent surgical history.  OB History    No data available       Home Medications    Prior to Admission medications   Medication Sig Start Date End Date Taking? Authorizing Provider  cephALEXin (KEFLEX) 500 MG capsule Take 1 capsule (500 mg total) by mouth 4 (four) times daily. 12/07/16   Raeford Razor, MD  Multiple Vitamin (MULTIVITAMIN) capsule Take 1 capsule by mouth daily.    [provider]  predniSONE (DELTASONE) 10 MG tablet Take 4 tablets (40 mg total) by mouth  daily with breakfast for 4 days. 01/21/18 01/25/18  Michela Pitcher A, PA-C  traMADol (ULTRAM) 50 MG tablet Take 1 tablet (50 mg total) by mouth every 6 (six) hours as needed. 12/07/16   Raeford Razor, MD    Family History History reviewed. No pertinent family history.  Social History Social History   Tobacco Use  . Smoking status: Never Smoker  . Smokeless tobacco: Never Used  Substance Use Topics  . Alcohol use: Yes    Comment: on occasion  . Drug use: No     Allergies   Aspirin   Review of Systems Review of Systems  Constitutional: Positive for chills and fever.  HENT: Positive for congestion, sneezing and sore throat.   Respiratory: Positive for cough, shortness of breath and wheezing.   Cardiovascular: Negative for chest pain.  All other systems reviewed and are negative.    Physical Exam Updated Vital Signs BP 139/69 (BP Location: Right Arm)   Pulse 76   Temp 98.2 F (36.8 C) (Oral)   Resp 16   Ht 5\' 4"  (1.626 m)   Wt 86.2 kg (190 lb)   LMP 12/30/2017   SpO2 98%   BMI 32.61 kg/m   Physical Exam  Constitutional: She appears well-developed and well-nourished. No distress.  HENT:  Head: Normocephalic and atraumatic.  Mouth/Throat: Oropharynx is clear and moist.  No frontal or maxillary sinus tenderness.  Nasal septum midline with mild mucosal edema bilaterally.  TMs without erythema or bulging bilaterally.  Posterior oropharynx with mild erythema, no tonsillar hypertrophy, exudates, or uvular deviation.  Eyes: Conjunctivae are normal. Right eye exhibits no discharge. Left eye exhibits no discharge.  Neck: Normal range of motion. No JVD present. No tracheal deviation present.  Cardiovascular: Normal rate, regular rhythm, normal heart sounds and intact distal pulses.  Pulmonary/Chest: Effort normal. No accessory muscle usage. No respiratory distress. She has wheezes in the right middle field, the right lower field, the left middle field and the left lower field. She  exhibits no tenderness.  Mildly increased respiratory rate after coughing fit.  Speaking in full sentences without difficulty.  Equal rise and fall of chest, no increased work of breathing.  Diffuse inspiratory and expiratory wheezing in the posterior lung fields  Abdominal: She exhibits no distension.  Musculoskeletal: Normal range of motion. She exhibits no edema.       Right lower leg: Normal.       Left lower leg: Normal.  Lymphadenopathy:    She has no cervical adenopathy.  Neurological: She is alert.  Skin: Skin is warm and dry. No erythema.  Psychiatric: She has a normal mood and affect. Her behavior is normal.  Nursing note and vitals reviewed.    ED Treatments / Results  Labs (all labs ordered are listed, but only abnormal results are displayed) Labs Reviewed - No data to display  EKG  EKG Interpretation None       Radiology Dg Chest 2 View  Result Date: 01/21/2018 CLINICAL DATA:  Shortness of breath EXAM: CHEST  2 VIEW COMPARISON:  Chest radiograph 12/24/2012 FINDINGS: The heart size and mediastinal contours are within normal limits. Both lungs are clear. The visualized skeletal structures are unremarkable. IMPRESSION: Clear lungs. Electronically Signed   By: Deatra RobinsonKevin  Herman M.D.   On: 01/21/2018 20:57    Procedures Procedures (including critical care time)  Medications Ordered in ED Medications  predniSONE (DELTASONE) tablet 60 mg (not administered)  albuterol (PROVENTIL HFA;VENTOLIN HFA) 108 (90 Base) MCG/ACT inhaler 1 puff (1 puff Inhalation Provided for home use 01/21/18 2204)  ipratropium-albuterol (DUONEB) 0.5-2.5 (3) MG/3ML nebulizer solution 3 mL (3 mLs Nebulization Given 01/21/18 2201)  albuterol (PROVENTIL) (2.5 MG/3ML) 0.083% nebulizer solution 2.5 mg (2.5 mg Nebulization Given 01/21/18 2201)     Initial Impression / Assessment and Plan / ED Course  I have reviewed the triage vital signs and the nursing notes.  Pertinent labs & imaging results that were  available during my care of the patient were reviewed by me and considered in my medical decision making (see chart for details).     Patient presents with 6 days of flulike symptoms.  She has tested positive for influenza A earlier this week.  Initially mildly hypertensive and tachycardic with significant improvement on reevaluation.  Patient was initially quite anxious in appearance on my assessment.  She exhibits no increased work of breathing, hypoxia, or retractions.  She did have wheezing on auscultation of the lungs in the posterior lung field but this significantly improved after breathing treatment.  Chest x-ray shows no evidence of consolidation or effusion, doubt post influenza pneumonia especially in the absence of fever.  Presentation is not concerning for meningitis, strep pharyngitis, PTA, or bronchitis.  I doubt PE given patient is low risk and symptoms appear to be more infectious in etiology.  On reevaluation, patient states she is feeling much better and wheezing has almost entirely  resolved on auscultation of the lungs.  Will discharge with prednisone and albuterol inhaler as needed for shortness of breath.  Continue symptomatic treatment of her flulike symptoms.  Recommend follow-up with PCP for reevaluation of symptoms.  Discussed indications for return to the ED. Pt and family friend verbalized understanding of and agreement with plan and is safe for discharge home at this time.  No complaints prior to discharge.  Final Clinical Impressions(s) / ED Diagnoses   Final diagnoses:  Flu-like symptoms  SOB (shortness of breath)  Wheezing    ED Discharge Orders        Ordered    predniSONE (DELTASONE) 10 MG tablet  Daily with breakfast     01/21/18 2235       Jeanie Sewer, PA-C 01/21/18 2238    Doug Sou, MD 01/21/18 2253

## 2018-01-21 NOTE — ED Triage Notes (Signed)
Pt with sob and wheezing since yesterday, pt dx with flu. Pt states her kids have flu.

## 2018-01-27 IMAGING — CT CT ABD-PELV W/ CM
2 of 4 series · 15 of 46 positions shown, 17 images · IV contrast (iopamidol)
Comparison: None.

CLINICAL DATA: Acute onset periumbilical and right lower quadrant
pain this morning. No known injury.

EXAM:
CT ABDOMEN AND PELVIS WITH CONTRAST
TECHNIQUE: Multidetector CT imaging of the abdomen and pelvis was performed
using the standard protocol following bolus administration of
intravenous contrast.
CONTRAST:  100 ml PS8J0W-7MM IOPAMIDOL (PS8J0W-7MM) INJECTION 61%

[Series 2: axial st · axial · 0.68mm/px · z∈[-439,-9]mm · 12 of 96 slices shown, 14 images]
[im 5/96  soft-tissue]
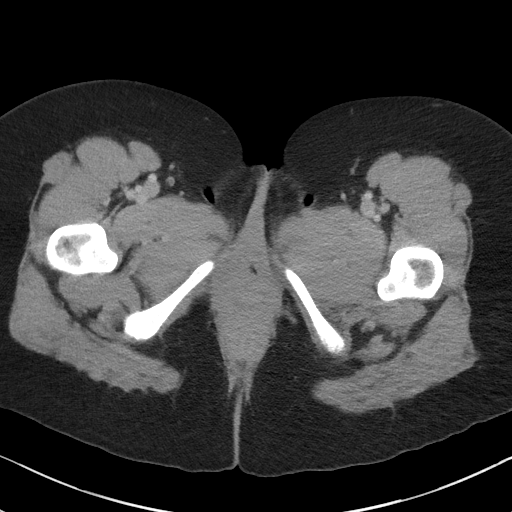
[im 5/96  bone]
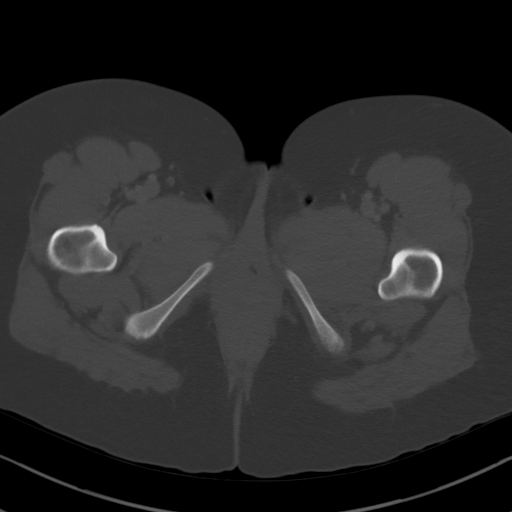
[im 13/96  soft-tissue]
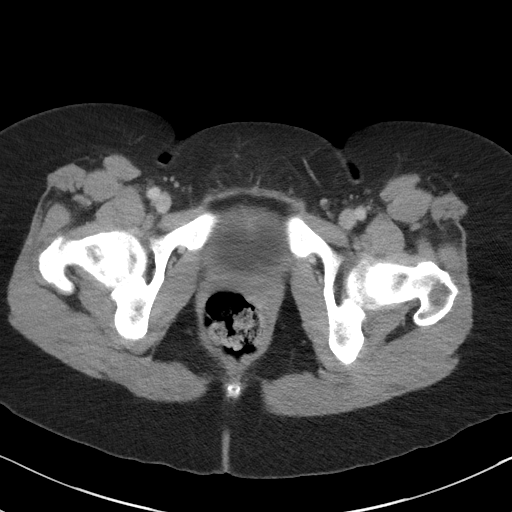
[im 21/96  soft-tissue]
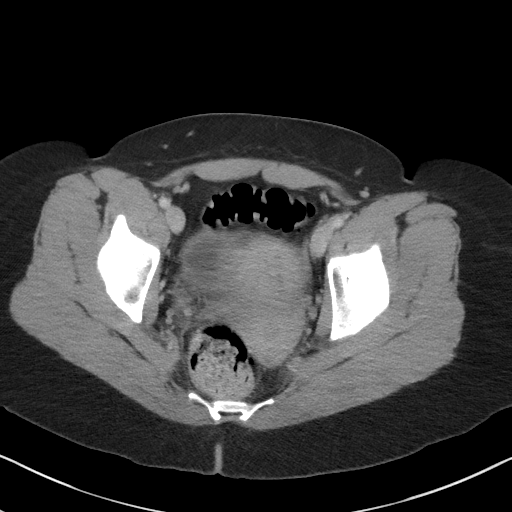
[im 29/96  soft-tissue]
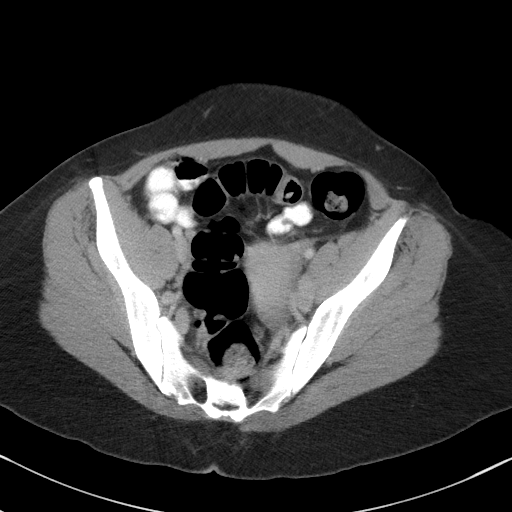
[im 38/96  soft-tissue]
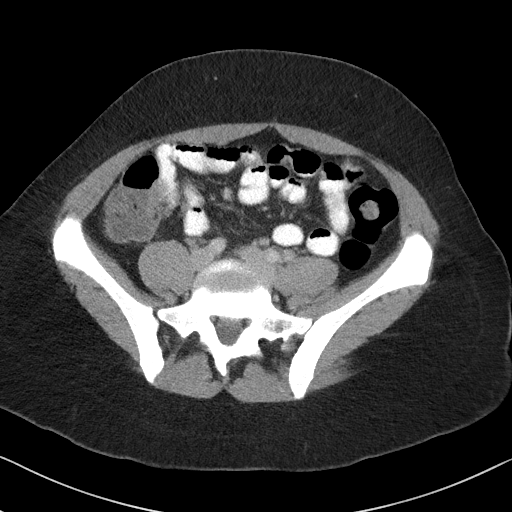
[im 46/96  soft-tissue]
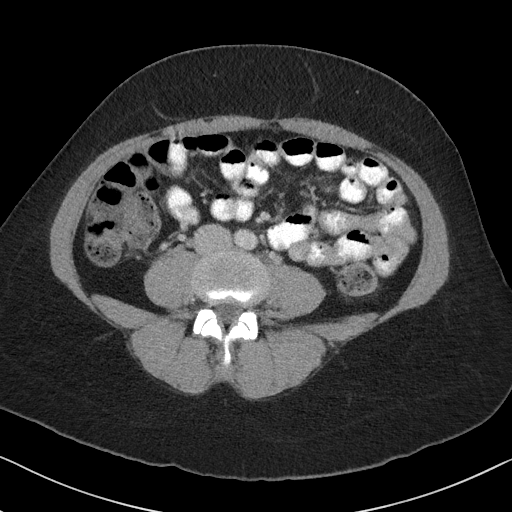
[im 50/96  soft-tissue]
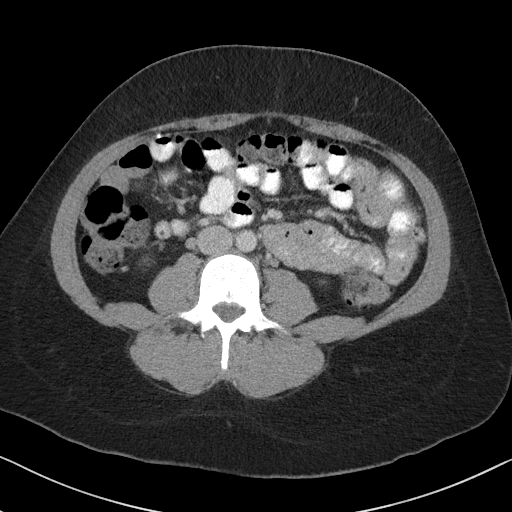
[im 58/96  soft-tissue]
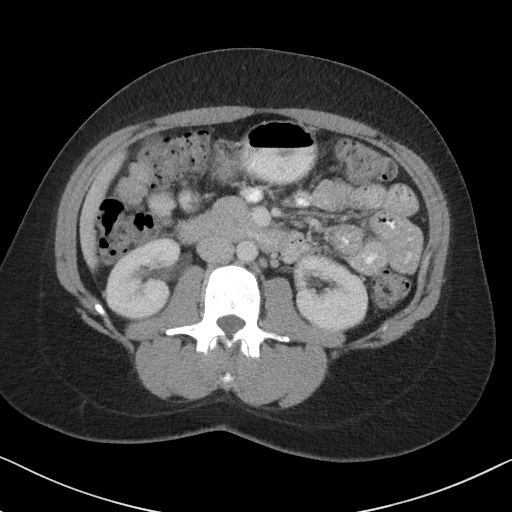
[im 67/96  soft-tissue]
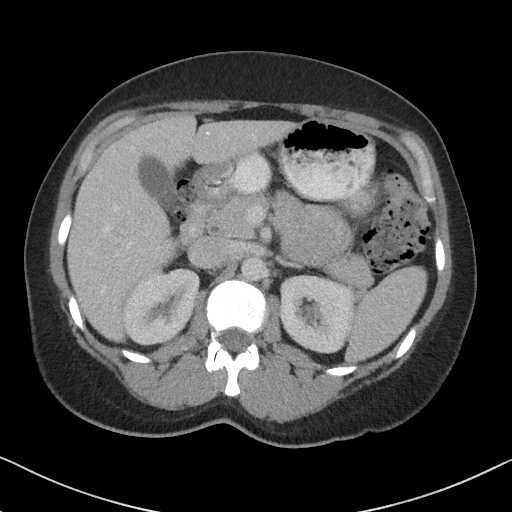
[im 67/96  bone]
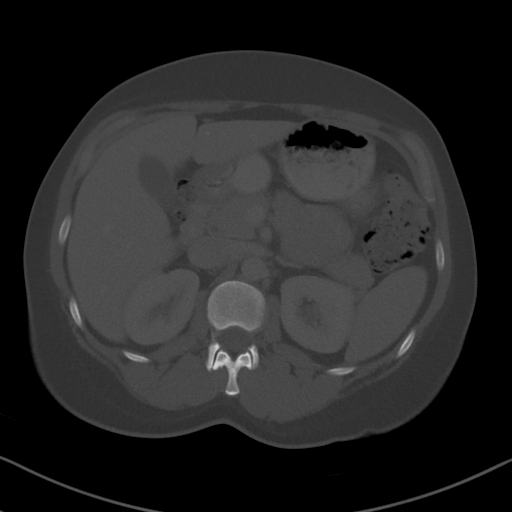
[im 75/96  soft-tissue]
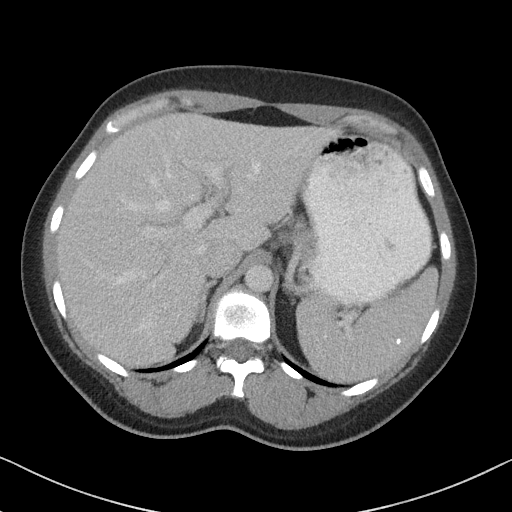
[im 83/96  soft-tissue]
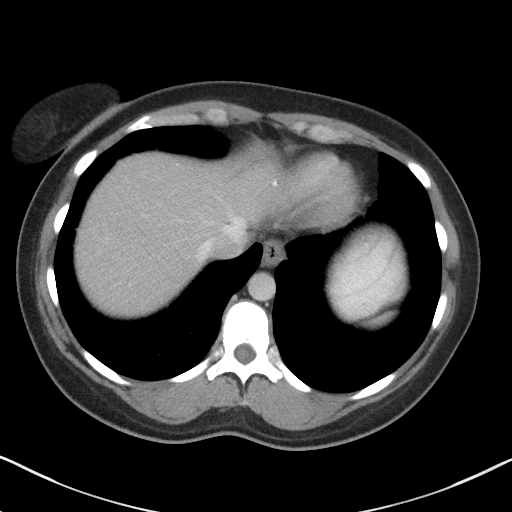
[im 91/96  soft-tissue]
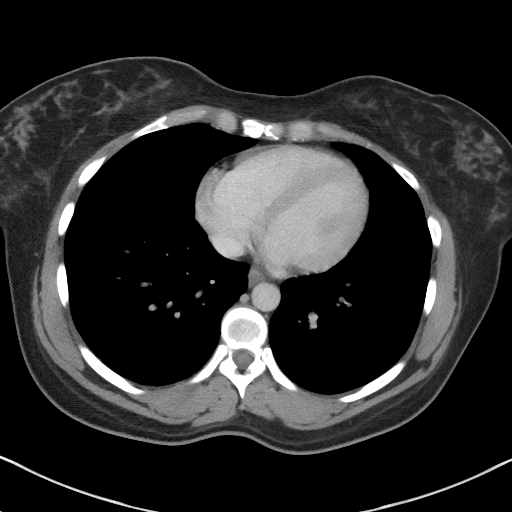

[Series 5: coronal st · coronal · 0.69mm/px · 3 of 99 slices shown]
[im 33/99  soft-tissue]
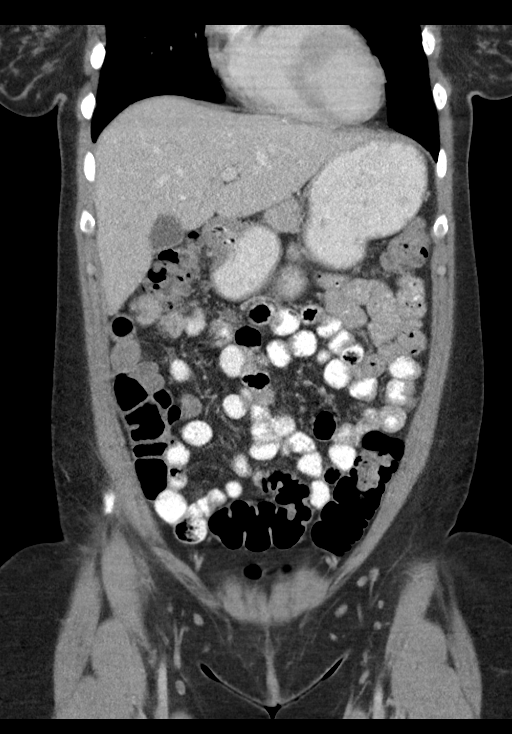
[im 44/99  soft-tissue]
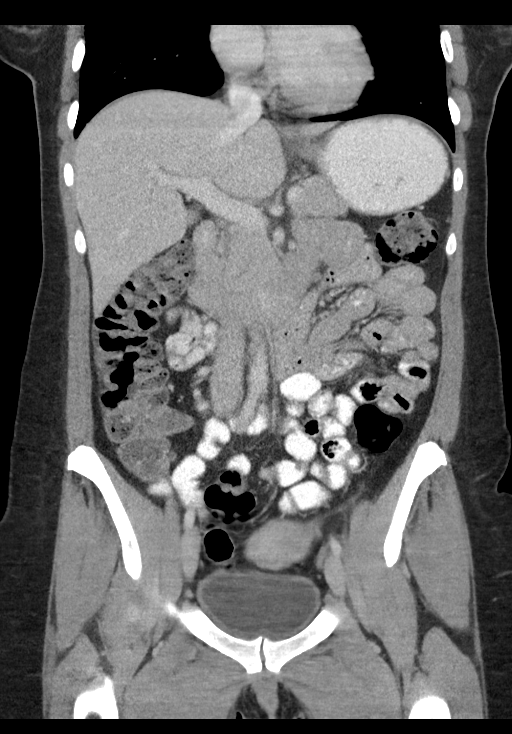
[im 55/99  soft-tissue]
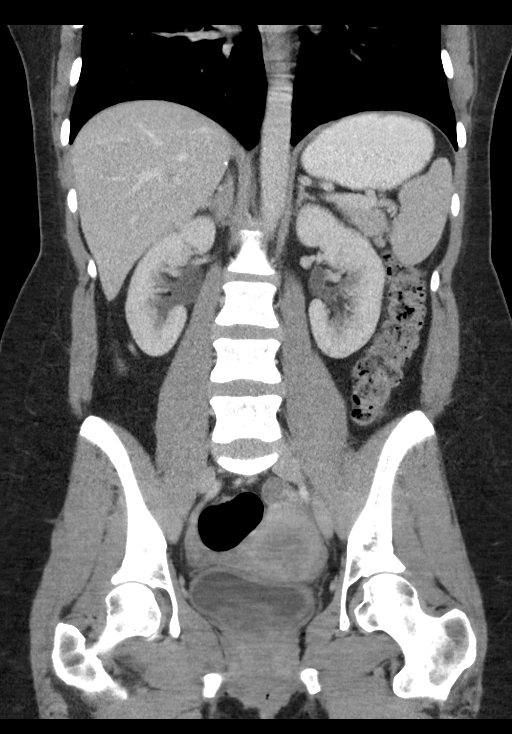

[15 of 46 positions shown; findings below may reference images not displayed]

FINDINGS: Lower chest: Lung bases are clear. No pleural or pericardial
effusion.

Hepatobiliary: 0.9 cm hypoattenuating lesion in the right hepatic
lobe on image 17 is likely a cyst. 2-3 punctate calcifications are
also seen in the liver. Gallbladder and biliary tree appear normal.

Pancreas: Unremarkable. No pancreatic ductal dilatation or
surrounding inflammatory changes.

Spleen: Normal in size. A few small calcifications are identified in
the spleen.

Adrenals/Urinary Tract: Adrenal glands are unremarkable. Kidneys are
normal, without renal calculi, focal lesion, or hydronephrosis.
Bladder is unremarkable.

Stomach/Bowel: Stomach is within normal limits. Appendix appears
normal. No evidence of bowel wall thickening, distention, or
inflammatory changes.

Vascular/Lymphatic: No significant vascular findings are present. No
enlarged abdominal or pelvic lymph nodes.

Reproductive: Uterus and bilateral adnexa are unremarkable.

Other: Small fat containing umbilical hernia is seen.

Musculoskeletal: Negative.
IMPRESSION: Negative for appendicitis. No acute abnormality or finding to
explain the patient's symptoms.

Small fat containing umbilical hernia.

Punctate calcifications in the spleen are most consistent with old
granulomatous disease.

## 2022-11-26 ENCOUNTER — Emergency Department (HOSPITAL_COMMUNITY)
Admission: EM | Admit: 2022-11-26 | Discharge: 2022-11-26 | Disposition: A | Discharge status: Home / Self Care | Payer: BLUE CROSS/BLUE SHIELD | Attending: Emergency Medicine | Admitting: Emergency Medicine

## 2022-11-26 ENCOUNTER — Emergency Department (HOSPITAL_COMMUNITY): Payer: BLUE CROSS/BLUE SHIELD

## 2022-11-26 ENCOUNTER — Encounter (HOSPITAL_COMMUNITY): Payer: Self-pay | Admitting: Emergency Medicine

## 2022-11-26 DIAGNOSIS — I471 Supraventricular tachycardia, unspecified: Secondary | ICD-10-CM | POA: Diagnosis not present

## 2022-11-26 DIAGNOSIS — R000 Tachycardia, unspecified: Secondary | ICD-10-CM | POA: Diagnosis present

## 2022-11-26 LAB — I-STAT BETA HCG BLOOD, ED (MC, WL, AP ONLY): I-stat hCG, quantitative: <5 m[IU]/mL (ref <5)

## 2022-11-26 LAB — CBC WITH DIFFERENTIAL/PLATELET
Abs Immature Granulocytes: 0.02 10*3/uL (ref 0.00–0.07)
Basophils Absolute: 0 10*3/uL (ref 0.0–0.1)
Basophils Relative: 1 %
Eosinophils Absolute: 0.1 10*3/uL (ref 0.0–0.5)
Eosinophils Relative: 2 %
HCT: 43.5 % (ref 36.0–46.0)
Hemoglobin: 14.5 g/dL (ref 12.0–15.0)
Immature Granulocytes: 0 %
Lymphocytes Relative: 21 %
Lymphs Abs: 1.2 10*3/uL (ref 0.7–4.0)
MCH: 31.1 pg (ref 26.0–34.0)
MCHC: 33.3 g/dL (ref 30.0–36.0)
MCV: 93.3 fL (ref 80.0–100.0)
Monocytes Absolute: 0.4 10*3/uL (ref 0.1–1.0)
Monocytes Relative: 7 %
Neutro Abs: 4 10*3/uL (ref 1.7–7.7)
Neutrophils Relative %: 69 %
Platelets: 229 10*3/uL (ref 150–400)
RBC: 4.66 MIL/uL (ref 3.87–5.11)
RDW: 12.4 % (ref 11.5–15.5)
WBC: 5.7 10*3/uL (ref 4.0–10.5)
nRBC: 0 % (ref 0.0–0.2)

## 2022-11-26 LAB — COMPREHENSIVE METABOLIC PANEL
ALT: 21 U/L (ref 0–44)
AST: 29 U/L (ref 15–41)
Albumin: 3.8 g/dL (ref 3.5–5.0)
Alkaline Phosphatase: 66 U/L (ref 38–126)
Anion gap: 8 (ref 5–15)
BUN: 14 mg/dL (ref 6–20)
CO2: 20 mmol/L — ABNORMAL LOW (ref 22–32)
Calcium: 8.6 mg/dL — ABNORMAL LOW (ref 8.9–10.3)
Chloride: 111 mmol/L (ref 98–111)
Creatinine, Ser: 0.85 mg/dL (ref 0.44–1.00)
GFR, Estimated: >60 mL/min (ref >60)
Glucose, Bld: 129 mg/dL — ABNORMAL HIGH (ref 70–99)
Potassium: 3.3 mmol/L — ABNORMAL LOW (ref 3.5–5.1)
Sodium: 139 mmol/L (ref 135–145)
Total Bilirubin: 0.5 mg/dL (ref 0.3–1.2)
Total Protein: 6.6 g/dL (ref 6.5–8.1)

## 2022-11-26 LAB — MAGNESIUM: Magnesium: 2 mg/dL (ref 1.7–2.4)

## 2022-11-26 MED ORDER — POTASSIUM CHLORIDE CRYS ER 20 MEQ PO TBCR
40.0000 meq | EXTENDED_RELEASE_TABLET | Freq: Once | ORAL | Status: AC
  Administered 2022-11-26: 40 meq via ORAL
  Filled 2022-11-26: qty 2

## 2022-11-26 MED ORDER — POTASSIUM CHLORIDE CRYS ER 20 MEQ PO TBCR: 20.0000 meq | EXTENDED_RELEASE_TABLET | Freq: Every day | ORAL | 0 refills | Status: DC

## 2022-11-26 NOTE — ED Triage Notes (Signed)
Pt arrives via EMS from work with SVT, HR 300. EMS gave 6 mg adenosine. Pt took home 25 mg metoprolol. Pt converted after medication. Hx of SVT

## 2022-11-26 NOTE — Discharge Instructions (Addendum)
You were seen in the emergency department today for an episode of supraventricular tachycardia. You were converted to a normal rhythm by the EMS team. Please call and follow-up with your [facility] cardiology team and electrophysiologist cardiology physician for possible ablation, medication check. Your magnesium was normal. Your potassium was only slightly decreased at 3.3. We will give you oral potassium replacement and this should be rechecked after completed in 10 days.. Please return for any recurrence of symptoms.

## 2022-11-26 NOTE — ED Provider Notes (Signed)
[facility] EMERGENCY DEPARTMENT Provider Note   CSN: [id number] Arrival date & time: 11/26/22  1524     History  Chief Complaint  Patient presents with   Irregular Heart Beat    Debra Ramos is a 41 y.o. female. With past medical history of SVT who presents to the emergency department with episode of tachycardia.  States that about 10 minutes prior to calling EMS she suddenly began having palpitations, lightheadedness and shortness of breath. She has history of SVT and states these symptoms felt similar. Denies syncope, chest pain. Per EMS had initial HR in the 260s. She was given 1L IVF and 6mg  adenosine with conversion to NSR. States she has had previous episodes and converted with vagal maneuvers or adenosine. Denies previous electrical cardioversion. She did state last time she was in SVT, this decompensated into torsades. She is followed by [facility] cardiology and takes 25mg  metoprolol BID. Has been compliant on this medication. Denies recent illnesses. Denies smoking or excessive caffeine use or thyroid disease.  HPI     Home Medications Prior to Admission medications   Medication Sig Start Date End Date Taking? Authorizing Provider  potassium chloride SA (KLOR-CON M) 20 MEQ tablet Take 1 tablet (20 mEq total) by mouth daily for 10 days. 11/26/22 12/06/22 Yes 11/28/22, PA-C  cephALEXin (KEFLEX) 500 MG capsule Take 1 capsule (500 mg total) by mouth 4 (four) times daily. 12/07/16   Cristopher Peru, MD  Multiple Vitamin (MULTIVITAMIN) capsule Take 1 capsule by mouth daily.    [provider]  traMADol (ULTRAM) 50 MG tablet Take 1 tablet (50 mg total) by mouth every 6 (six) hours as needed. 12/07/16   Raeford Razor, MD      Allergies    Aspirin    Review of Systems   Review of Systems  Respiratory:  Positive for shortness of breath.   Cardiovascular:  Positive for palpitations.  Neurological:  Positive for light-headedness. Negative for syncope.   All other systems reviewed and are negative.   Physical Exam Updated Vital Signs BP 123/82   Pulse 94   Temp 98.7 F (37.1 C) (Oral)   Resp 13   SpO2 99%  Physical Exam Vitals and nursing note reviewed.  Constitutional:      General: She is not in acute distress.    Appearance: Normal appearance. She is ill-appearing and diaphoretic.  HENT:     Head: Normocephalic.  Eyes:     General: No scleral icterus.    Extraocular Movements: Extraocular movements intact.  Cardiovascular:     Rate and Rhythm: Normal rate and regular rhythm.     Pulses: Normal pulses.     Heart sounds: No murmur heard. Pulmonary:     Effort: Pulmonary effort is normal. No respiratory distress.     Breath sounds: Normal breath sounds. No stridor. No wheezing, rhonchi or rales.  Abdominal:     General: Bowel sounds are normal. There is no distension.     Palpations: Abdomen is soft.     Tenderness: There is no abdominal tenderness.  Musculoskeletal:        General: Normal range of motion.     Cervical back: Normal range of motion and neck supple.     Right lower leg: No edema.     Left lower leg: No edema.  Skin:    General: Skin is warm.     Capillary Refill: Capillary refill takes less than 2 seconds.  Neurological:  General: No focal deficit present.     Mental Status: She is alert and oriented to person, place, and time. Mental status is at baseline.  Psychiatric:        Mood and Affect: Mood normal.        Behavior: Behavior normal.        Thought Content: Thought content normal.        Judgment: Judgment normal.     ED Results / Procedures / Treatments   Labs (all labs ordered are listed, but only abnormal results are displayed) Labs Reviewed  COMPREHENSIVE METABOLIC PANEL - Abnormal; Notable for the following components:      Result Value   Potassium 3.3 (*)    CO2 20 (*)    Glucose, Bld 129 (*)    Calcium 8.6 (*)    All other components within normal limits  CBC WITH  DIFFERENTIAL/PLATELET  MAGNESIUM  URINALYSIS, ROUTINE W REFLEX MICROSCOPIC  I-STAT BETA HCG BLOOD, ED ([facility], [facility], [facility] ONLY)  CBG MONITORING, ED    EKG EKG Interpretation  Date/Time:  Friday November 26 2022 15:42:08 EST Ventricular Rate:  88 PR Interval:  146 QRS Duration: 97 QT Interval:  346 QTC Calculation: 419 R Axis:   65 Text Interpretation: Sinus rhythm Confirmed by Alvester Chou [phone]) on 11/26/2022 3:44:14 PM  Radiology DG Chest Port 1 View  Result Date: 11/26/2022 CLINICAL DATA:  Shortness of breath EXAM: PORTABLE CHEST 1 VIEW COMPARISON:  01/21/2018 FINDINGS: The heart size and mediastinal contours are within normal limits. Both lungs are clear. The visualized skeletal structures are unremarkable. IMPRESSION: No active disease. Electronically Signed   By: Jasmine Pang M.D.   On: 11/26/2022 16:16    Procedures Procedures   Medications Ordered in ED Medications  potassium chloride SA (KLOR-CON M) CR tablet 40 mEq (has no administration in time range)    ED Course/ Medical Decision Making/ A&P Clinical Course as of 11/26/22 1721  Fri Nov 26, 2022  1613 This is a very nice 41 year old female with a history of SVT on metoprolol twice daily, follows with [facility] cardiology, presenting to the ED with tachycardia now resolved.  Reports that she was at work and felt her heart rate go up very high, EMS reports heart rate over 200, and patient was given adenosine with cardioversion before arriving in the hospital.  She says she may have been tachycardic for 5 to 10 minutes.  Now she feels tired but otherwise back to normal.  She has had several episodes in the past, she has tried vagal maneuvers.  She is a Engineer, civil (consulting).  She says she will likely follow-up with electrophysiology this time around.  She is stable in the room, no distress.  No evidence of volume overload.  We are waiting to check her electrolyte levels that she has had problems with electrolyte imbalances in the past.  She does  not consume caffeine, does not smoke. [MT]  1716 Seems mildly low we will give some oral potassium and prescribe supplements for few days at home.  I reviewed her telemetry and she has not had any events or PVCs noted.  Okay for discharge [MT]    Clinical Course User Index [MT] Trifan, Kermit Balo, MD                           Medical Decision Making Amount and/or Complexity of Data Reviewed Labs: ordered. Radiology: ordered.  Risk Prescription drug management.  Initial  Impression and Ddx 41 year old female who presents after episode of SVT at work. Converted with EMS after 6mg  adenosine. Received 1L IVF. She is stable on initial presentation. Not first episode of SVT.  Patient PMH that increases complexity of ED encounter:  SVT Differential: electrolyte disturbance, dehydration, pregnancy, etc.   Interpretation of Diagnostics I independent reviewed and interpreted the labs as followed: K 3.3, Mag 2, cbc wnl, not pregnant  - I independently visualized the following imaging with scope of interpretation limited to determining acute life threatening conditions related to emergency care: CXR, which revealed not acute findings  Patient Reassessment and Ultimate Disposition/Management 41 year old who presents after SVT in 200s, converted by EMS after 6mg  adenosine, 1L IVF. Stable, non septic, non toxic appearing. She has no chest pain, SOB, palpitations currently.. Feels tired now.  Obtained electrolytes including magnesium, pregnancy.  CXR wnl  EKG without ischemia or infarction. QTC normal. No evidence of HOCM pattern or WPW. No familial history of sudden cardiac death.  Mag normal. K is mildly decreased at 3.3, but I do not think this is nidus of SVT episode. Will give her replacement. No evidence of HF on exam.  Observed here without recurrence of SVT or symptoms. She has had SVT previously multiple times. Followed by [facility]. She was to have EP consult but wanted to hold off on this.  We discussed her following up with EP to avoid further episodes and she agrees. Will give her PO K replacement here and 10 days at home. Instructed to f/u on BMP after replacement.   The patient has been appropriately medically screened and/or stabilized in the ED. I have low suspicion for any other emergent medical condition which would require further screening, evaluation or treatment in the ED or require inpatient management. At time of discharge the patient is hemodynamically stable and in no acute distress. I have discussed work-up results and diagnosis with patient and answered all questions. Patient is agreeable with discharge plan. We discussed strict return precautions for returning to the emergency department and they verbalized understanding.    Patient management required discussion with the following services or consulting groups:  None  Complexity of Problems Addressed Acute complicated illness or Injury  Additional Data Reviewed and Analyzed Further history obtained from: EMS on arrival, Past medical history and medications listed in the EMR, Care Everywhere, and Prior labs/imaging results  Patient Encounter Risk Assessment Prescriptions  Final Clinical Impression(s) / ED Diagnoses Final diagnoses:  SVT (supraventricular tachycardia)    Rx / DC Orders ED Discharge Orders          Ordered    potassium chloride SA (KLOR-CON M) 20 MEQ tablet  Daily        11/26/22 1712              [facility], PA-C 11/26/22 1722    Cristopher Peru, MD 11/26/22 1753

## 2024-02-13 ENCOUNTER — Emergency Department (HOSPITAL_COMMUNITY)

## 2024-02-13 ENCOUNTER — Emergency Department (HOSPITAL_COMMUNITY)
Admission: EM | Admit: 2024-02-13 | Discharge: 2024-02-13 | Disposition: A | Discharge status: Home / Self Care | Attending: Emergency Medicine | Admitting: Emergency Medicine

## 2024-02-13 ENCOUNTER — Encounter (HOSPITAL_COMMUNITY): Payer: Self-pay

## 2024-02-13 ENCOUNTER — Other Ambulatory Visit: Payer: Self-pay

## 2024-02-13 DIAGNOSIS — Z79899 Other long term (current) drug therapy: Secondary | ICD-10-CM | POA: Insufficient documentation

## 2024-02-13 DIAGNOSIS — E876 Hypokalemia: Secondary | ICD-10-CM | POA: Diagnosis not present

## 2024-02-13 DIAGNOSIS — I471 Supraventricular tachycardia, unspecified: Secondary | ICD-10-CM | POA: Insufficient documentation

## 2024-02-13 DIAGNOSIS — R002 Palpitations: Secondary | ICD-10-CM | POA: Diagnosis present

## 2024-02-13 LAB — COMPREHENSIVE METABOLIC PANEL
ALT: 28 U/L (ref 0–44)
AST: 31 U/L (ref 15–41)
Albumin: 4.4 g/dL (ref 3.5–5.0)
Alkaline Phosphatase: 70 U/L (ref 38–126)
Anion gap: 15 (ref 5–15)
BUN: 17 mg/dL (ref 6–20)
CO2: 19 mmol/L — ABNORMAL LOW (ref 22–32)
Calcium: 9.2 mg/dL (ref 8.9–10.3)
Chloride: 104 mmol/L (ref 98–111)
Creatinine, Ser: 0.85 mg/dL (ref 0.44–1.00)
GFR, Estimated: >60 mL/min (ref >60)
Glucose, Bld: 106 mg/dL — ABNORMAL HIGH (ref 70–99)
Potassium: 3.2 mmol/L — ABNORMAL LOW (ref 3.5–5.1)
Sodium: 138 mmol/L (ref 135–145)
Total Bilirubin: 0.6 mg/dL (ref 0.0–1.2)
Total Protein: 7.7 g/dL (ref 6.5–8.1)

## 2024-02-13 LAB — CBC WITH DIFFERENTIAL/PLATELET
Abs Immature Granulocytes: 0.02 10*3/uL (ref 0.00–0.07)
Basophils Absolute: 0 10*3/uL (ref 0.0–0.1)
Basophils Relative: 1 %
Eosinophils Absolute: 0.3 10*3/uL (ref 0.0–0.5)
Eosinophils Relative: 4 %
HCT: 44.6 % (ref 36.0–46.0)
Hemoglobin: 14.7 g/dL (ref 12.0–15.0)
Immature Granulocytes: 0 %
Lymphocytes Relative: 35 %
Lymphs Abs: 2.7 10*3/uL (ref 0.7–4.0)
MCH: 30.1 pg (ref 26.0–34.0)
MCHC: 33 g/dL (ref 30.0–36.0)
MCV: 91.2 fL (ref 80.0–100.0)
Monocytes Absolute: 0.6 10*3/uL (ref 0.1–1.0)
Monocytes Relative: 8 %
Neutro Abs: 4.1 10*3/uL (ref 1.7–7.7)
Neutrophils Relative %: 52 %
Platelets: 289 10*3/uL (ref 150–400)
RBC: 4.89 MIL/uL (ref 3.87–5.11)
RDW: 12.9 % (ref 11.5–15.5)
WBC: 7.7 10*3/uL (ref 4.0–10.5)
nRBC: 0 % (ref 0.0–0.2)

## 2024-02-13 LAB — MAGNESIUM: Magnesium: 2.3 mg/dL (ref 1.7–2.4)

## 2024-02-13 MED ORDER — POTASSIUM CHLORIDE ER 10 MEQ PO TBCR: 10.0000 meq | EXTENDED_RELEASE_TABLET | Freq: Every day | ORAL | 0 refills | Status: AC

## 2024-02-13 MED ORDER — METOPROLOL TARTRATE 5 MG/5ML IV SOLN
5.0000 mg | Freq: Once | INTRAVENOUS | Status: AC
  Administered 2024-02-13: 5 mg via INTRAVENOUS
  Filled 2024-02-13: qty 5

## 2024-02-13 MED ORDER — ADENOSINE 6 MG/2ML IV SOLN
INTRAVENOUS | Status: AC
  Administered 2024-02-13: 6 mg via INTRAVENOUS
  Filled 2024-02-13: qty 6

## 2024-02-13 MED ORDER — ADENOSINE 6 MG/2ML IV SOLN: 6.0000 mg | Freq: Once | INTRAVENOUS | Status: AC

## 2024-02-13 MED ORDER — POTASSIUM CHLORIDE CRYS ER 20 MEQ PO TBCR
40.0000 meq | EXTENDED_RELEASE_TABLET | Freq: Once | ORAL | Status: AC
  Administered 2024-02-13: 40 meq via ORAL

## 2024-02-13 NOTE — Discharge Instructions (Signed)
 Please follow-up with the cardiologist at the [facility] to pursue the ablation, continue to take your metoprolol exactly as prescribed, ER for severe worsening symptoms

## 2024-02-13 NOTE — ED Triage Notes (Signed)
 Pt BIB EMS with c/o tachycardia with a in the 200s that started this evening. Pt has hx of the SVT. Pt has Per pt, she had palpitations this morning, so she took 400mg  of magnesium this morning. Last time pt had an episode was about 8 months ago. Pt denies CP. Pt endorses SOB.

## 2024-02-13 NOTE — ED Provider Notes (Signed)
 [place] EMERGENCY DEPARTMENT AT [facility] Provider Note   CSN: [id number] Arrival date & time: 02/13/24  1840     History  Chief Complaint  Patient presents with   Tachycardia    Debra Ramos is a 43 y.o. female.  HPI   This patient is a 43 year old female, she presents from the grocery store where she had acute onset of palpitations, she has a history of SVT and is followed by the cardiology service at the [facility].  According to the medical record which I have reviewed the patient has a history of paroxysmal SVT, usually in the 200s, oftentimes self-limiting but sometimes requiring adenosine, the patient also takes Lopressor 25 twice a day, on a magnesium supplement, had been avoiding alcohol and caffeine is much as possible.  The plan was for an ablation, this was never performed according to the patient it is not clear why  The patient presents with severe shortness of breath palpitations and a heart rate over 200 according to the paramedics.  They were not able to get an IV prehospital, she did not do well with vagal maneuvers  Home Medications Prior to Admission medications   Medication Sig Start Date End Date Taking? Authorizing Provider  ascorbic acid (VITAMIN C) 500 MG tablet Take 500 mg by mouth daily.   Yes [provider]  Cholecalciferol (VITAMIN D3) 50 MCG (2000 UT) capsule Take 2,000 Units by mouth daily.   Yes [provider]  ferrous sulfate 325 (65 FE) MG EC tablet Take 325 mg by mouth daily with breakfast.   Yes [provider]  methocarbamol (ROBAXIN) 500 MG tablet Take 500 mg by mouth every 8 (eight) hours as needed for muscle spasms.   Yes [provider]  zinc gluconate 50 MG tablet Take 50 mg by mouth daily.   Yes [provider]  metoprolol tartrate (LOPRESSOR) 25 MG tablet Take 25 mg by mouth 2 (two) times daily. 08/15/23  Yes [provider]  naproxen  (NAPROSYN) 500 MG tablet Take 500 mg by mouth 2 (two) times daily with a meal. 02/04/24 03/05/24 Yes [provider]      Allergies    Aspirin    Review of Systems   Review of Systems  All other systems reviewed and are negative.   Physical Exam Updated Vital Signs BP 124/81   Pulse 96   Temp 98.2 F (36.8 C) (Oral)   Resp 16   SpO2 99%  Physical Exam Vitals and nursing note reviewed.  Constitutional:      General: She is in acute distress.     Appearance: She is well-developed. She is ill-appearing.  HENT:     Head: Normocephalic and atraumatic.     Mouth/Throat:     Pharynx: No oropharyngeal exudate.  Eyes:     General: No scleral icterus.       Right eye: No discharge.        Left eye: No discharge.     Conjunctiva/sclera: Conjunctivae normal.     Pupils: Pupils are equal, round, and reactive to light.  Neck:     Thyroid: No thyromegaly.     Vascular: No JVD.  Cardiovascular:     Rate and Rhythm: Regular rhythm. Tachycardia present.     Heart sounds: Normal heart sounds. No murmur heard.    No friction rub. No gallop.  Pulmonary:     Effort: Pulmonary effort is normal. No respiratory  distress.     Breath sounds: Normal breath sounds. No wheezing or rales.  Abdominal:     General: Bowel sounds are normal. There is no distension.     Palpations: Abdomen is soft. There is no mass.     Tenderness: There is no abdominal tenderness.  Musculoskeletal:        General: No tenderness. Normal range of motion.     Cervical back: Normal range of motion and neck supple.     Right lower leg: No edema.     Left lower leg: No edema.  Lymphadenopathy:     Cervical: No cervical adenopathy.  Skin:    General: Skin is warm and dry.     Findings: No erythema or rash.  Neurological:     Mental Status: She is alert.     Coordination: Coordination normal.  Psychiatric:        Behavior: Behavior normal.     ED Results / Procedures / Treatments   Labs (all labs  ordered are listed, but only abnormal results are displayed) Labs Reviewed  COMPREHENSIVE METABOLIC PANEL - Abnormal; Notable for the following components:      Result Value   Potassium 3.2 (*)    CO2 19 (*)    Glucose, Bld 106 (*)    All other components within normal limits  CBC WITH DIFFERENTIAL/PLATELET  MAGNESIUM    EKG EKG Interpretation Date/Time:  Monday February 13 2024 18:45:01 EDT Ventricular Rate:  210 PR Interval:  154 QRS Duration:  70 QT Interval:  251 QTC Calculation: 470 R Axis:   76  Text Interpretation: Supraventricular tachycardia ST depression, probably rate related Baseline wander in lead(s) I II aVR V5 Confirmed by Eber Hong ([id number]) on 02/13/2024 6:49:10 PM  Radiology DG Chest Port 1 View Result Date: 02/13/2024 CLINICAL DATA:  Cough, SVT EXAM: PORTABLE CHEST 1 VIEW COMPARISON:  11/26/2022 FINDINGS: The heart size and mediastinal contours are within normal limits. Both lungs are clear. The visualized skeletal structures are unremarkable. IMPRESSION: No active disease. Electronically Signed   By: Charlett Nose M.D.   On: 02/13/2024 20:26    Procedures .Cardioversion  Date/Time: 02/13/2024 6:54 PM  Performed by: Eber Hong, MD Authorized by: Eber Hong, MD   Consent:    Consent obtained:  Verbal and emergent situation   Consent given by:  Patient   Alternatives discussed:  Rate-control medication Universal protocol:    Procedure explained and questions answered to patient or proxy's satisfaction: yes     Immediately prior to procedure a time out was called: yes   Pre-procedure details:    Cardioversion basis:  Emergent   Rhythm:  Supraventricular tachycardia Patient sedated: No Post-procedure details:    Patient status:  Awake   Patient tolerance of procedure:  Tolerated well, no immediate complications Comments:     The patient was given 6 mg of adenosine with successful cardioversion back into mild sinus tachycardia, there was a run of  what appeared to be ventricular tachycardia immediately after the adenosine started, this lasted less than 5 seconds and she went back into sinus tachycardia rate of about 115 bpm  .Critical Care  Performed by: Eber Hong, MD Authorized by: Eber Hong, MD   Critical care provider statement:    Critical care time (minutes):  30   Critical care time was exclusive of:  Separately billable procedures and treating other patients and teaching time   Critical care was necessary to treat or prevent imminent or life-threatening deterioration  of the following conditions:  Cardiac failure   Critical care was time spent personally by me on the following activities:  Development of treatment plan with patient or surrogate, discussions with consultants, evaluation of patient's response to treatment, examination of patient, ordering and review of laboratory studies, ordering and review of radiographic studies, ordering and performing treatments and interventions, pulse oximetry, re-evaluation of patient's condition, review of old charts and obtaining history from patient or surrogate   I assumed direction of critical care for this patient from another provider in my specialty: no   Comments:      Chemical cardioversion successful     Medications Ordered in ED Medications  adenosine (ADENOCARD) 6 MG/2ML injection 6 mg (6 mg Intravenous Given 02/13/24 1850)  metoprolol tartrate (LOPRESSOR) injection 5 mg (5 mg Intravenous Given 02/13/24 1914)    ED Course/ Medical Decision Making/ A&P                                 Medical Decision Making Amount and/or Complexity of Data Reviewed Labs: ordered. Radiology: ordered.  Risk Prescription drug management.   The patient is tachypneic, she is breathing rapidly, her heart rate is 200, she is in SVT based on the EKG.  Will perform adenosine as she failed vagal maneuvers both prehospital and under my care.  Labs: I personally viewed and interpreted  labs which show no anemia no leukocytosis, mild hypokalemia  Imaging: I personally viewed and interpret the x-ray which is totally normal of the lungs.  Cardiac monitoring: Initially SVT, adenosine helped convert back to normal sinus rhythm which she has stayed in since that time.  Medication management: Given single dose of Lopressor which has helped to keep her in a rate controlled normal sinus rhythm.  I have discussed with the patient at the bedside the results, and the meaning of these results.  They have had opportunity to ask questions,  expressed their understanding to the need for follow-up with primary care physician  The patient is aware that she will need to follow-up for formal treatment and likely ablation with [facility].  She readily states that she did not follow-up when she was asked to because she was scared of the procedure        Final Clinical Impression(s) / ED Diagnoses Final diagnoses:  SVT (supraventricular tachycardia) [facility])    Rx / DC Orders ED Discharge Orders     None         Eber Hong, MD 02/13/24 2116

## 2024-03-11 ENCOUNTER — Emergency Department (HOSPITAL_COMMUNITY)

## 2024-03-11 ENCOUNTER — Emergency Department (HOSPITAL_COMMUNITY)
Admission: EM | Admit: 2024-03-11 | Discharge: 2024-03-11 | Disposition: A | Discharge status: Home / Self Care | Attending: Emergency Medicine | Admitting: Emergency Medicine

## 2024-03-11 ENCOUNTER — Other Ambulatory Visit: Payer: Self-pay

## 2024-03-11 ENCOUNTER — Encounter (HOSPITAL_COMMUNITY): Payer: Self-pay

## 2024-03-11 DIAGNOSIS — R079 Chest pain, unspecified: Secondary | ICD-10-CM | POA: Diagnosis present

## 2024-03-11 DIAGNOSIS — R0789 Other chest pain: Secondary | ICD-10-CM | POA: Insufficient documentation

## 2024-03-11 HISTORY — DX: Torsades de pointes: I47.21

## 2024-03-11 HISTORY — DX: Supraventricular tachycardia, unspecified: I47.10

## 2024-03-11 LAB — BASIC METABOLIC PANEL WITH GFR
Anion gap: 10 (ref 5–15)
BUN: 14 mg/dL (ref 6–20)
CO2: 22 mmol/L (ref 22–32)
Calcium: 9.1 mg/dL (ref 8.9–10.3)
Chloride: 106 mmol/L (ref 98–111)
Creatinine, Ser: 0.69 mg/dL (ref 0.44–1.00)
GFR, Estimated: >60 mL/min (ref >60)
Glucose, Bld: 102 mg/dL — ABNORMAL HIGH (ref 70–99)
Potassium: 3.7 mmol/L (ref 3.5–5.1)
Sodium: 138 mmol/L (ref 135–145)

## 2024-03-11 LAB — CBC
HCT: 40.4 % (ref 36.0–46.0)
Hemoglobin: 13.9 g/dL (ref 12.0–15.0)
MCH: 31 pg (ref 26.0–34.0)
MCHC: 34.4 g/dL (ref 30.0–36.0)
MCV: 90.2 fL (ref 80.0–100.0)
Platelets: 269 10*3/uL (ref 150–400)
RBC: 4.48 MIL/uL (ref 3.87–5.11)
RDW: 12.8 % (ref 11.5–15.5)
WBC: 5 10*3/uL (ref 4.0–10.5)
nRBC: 0 % (ref 0.0–0.2)

## 2024-03-11 LAB — HEPATIC FUNCTION PANEL
ALT: 23 U/L (ref 0–44)
AST: 22 U/L (ref 15–41)
Albumin: 3.8 g/dL (ref 3.5–5.0)
Alkaline Phosphatase: 71 U/L (ref 38–126)
Bilirubin, Direct: 0.1 mg/dL (ref 0.0–0.2)
Indirect Bilirubin: 0.8 mg/dL (ref 0.3–0.9)
Total Bilirubin: 0.9 mg/dL (ref 0.0–1.2)
Total Protein: 6.7 g/dL (ref 6.5–8.1)

## 2024-03-11 LAB — TROPONIN I (HIGH SENSITIVITY)
Troponin I (High Sensitivity): <2 ng/L (ref <18)
Troponin I (High Sensitivity): <2 ng/L (ref <18)

## 2024-03-11 LAB — D-DIMER, QUANTITATIVE: D-Dimer, Quant: 0.36 ug{FEU}/mL (ref 0.00–0.50)

## 2024-03-11 MED ORDER — SUCRALFATE 1 G PO TABS: 1.0000 g | ORAL_TABLET | Freq: Three times a day (TID) | ORAL | 0 refills | Status: AC

## 2024-03-11 MED ORDER — ALUM & MAG HYDROXIDE-SIMETH 200-200-20 MG/5ML PO SUSP
30.0000 mL | Freq: Once | ORAL | Status: AC
  Administered 2024-03-11: 30 mL via ORAL
  Filled 2024-03-11: qty 30

## 2024-03-11 MED ORDER — OMEPRAZOLE 20 MG PO CPDR: 20.0000 mg | DELAYED_RELEASE_CAPSULE | Freq: Every day | ORAL | 0 refills | Status: AC

## 2024-03-11 NOTE — ED Provider Notes (Signed)
 [place] EMERGENCY DEPARTMENT AT [facility] Provider Note   CSN: [id number] Arrival date & time: 03/11/24  1400     History  Chief Complaint  Patient presents with   Chest Pain    Debra Ramos is a 43 y.o. female.  Patient is a 43 year old female who presents to the emergency department with a chief complaint of left-sided chest pain with radiation into her neck which began approximately 2 hours prior to arrival.  Patient notes that she did take an over-the-counter acid reflux medication with no improvement in her symptoms.  She did call EMS and was given 324 of aspirin as well as nitroglycerin.  She notes that her pain has greatly improved at this time.  She notes that she does have a history of SVT but denies any other known cardiac history.  Patient denies any palpitations, sensation of heart racing or shortness of breath.  She denies any abdominal pain, nausea, vomiting, diarrhea.  She did note that the pain was worse with exertion.  She denies any positional component of the pain.   Chest Pain      Home Medications Prior to Admission medications   Medication Sig Start Date End Date Taking? Authorizing Provider  ascorbic acid (VITAMIN C) 500 MG tablet Take 500 mg by mouth daily.    [provider]  Cholecalciferol (VITAMIN D3) 50 MCG (2000 UT) capsule Take 2,000 Units by mouth daily.    [provider]  ferrous sulfate 325 (65 FE) MG EC tablet Take 325 mg by mouth daily with breakfast.    [provider]  methocarbamol (ROBAXIN) 500 MG tablet Take 500 mg by mouth every 8 (eight) hours as needed for muscle spasms.    [provider]  metoprolol tartrate (LOPRESSOR) 25 MG tablet Take 25 mg by mouth 2 (two) times daily. 08/15/23   [provider]  potassium chloride (KLOR-CON) 10 MEQ tablet Take 1 tablet (10 mEq total) by mouth daily for 7 days. 02/13/24 02/20/24  Early Glisson, MD  zinc gluconate 50 MG tablet Take 50 mg by  mouth daily.    [provider]      Allergies    Aspirin    Review of Systems   Review of Systems  Cardiovascular:  Positive for chest pain.  All other systems reviewed and are negative.   Physical Exam Updated Vital Signs BP 121/75   Pulse 65   Temp 98 F (36.7 C) (Oral)   Resp 14   Ht 5\' 4"  (1.626 m)   Wt 105.2 kg   LMP 03/11/2024 (Exact Date)   SpO2 99%   BMI 39.82 kg/m  Physical Exam Vitals and nursing note reviewed.  Constitutional:      Appearance: Normal appearance.  HENT:     Head: Normocephalic and atraumatic.     Nose: Nose normal.     Mouth/Throat:     Mouth: Mucous membranes are moist.  Eyes:     Extraocular Movements: Extraocular movements intact.     Conjunctiva/sclera: Conjunctivae normal.     Pupils: Pupils are equal, round, and reactive to light.  Cardiovascular:     Rate and Rhythm: Normal rate and regular rhythm.     Pulses: Normal pulses.     Heart sounds: Normal heart sounds. Heart sounds not distant. No murmur heard. Pulmonary:     Effort: Pulmonary effort is normal.     Breath sounds: Normal breath sounds. No decreased breath sounds, wheezing, rhonchi or rales.  Chest:     Chest wall: No tenderness.  Abdominal:     General: Abdomen is flat. Bowel sounds are normal.     Palpations: Abdomen is soft.     Tenderness: There is no abdominal tenderness. There is no guarding or rebound.  Musculoskeletal:        General: Normal range of motion.     Cervical back: Normal range of motion and neck supple.     Right lower leg: No edema.     Left lower leg: No edema.  Skin:    General: Skin is warm and dry.     Findings: No rash.  Neurological:     General: No focal deficit present.     Mental Status: She is alert and oriented to person, place, and time. Mental status is at baseline.     Cranial Nerves: No cranial nerve deficit.     Motor: No weakness.  Psychiatric:        Mood and Affect: Mood normal.        Behavior: Behavior  normal.        Thought Content: Thought content normal.        Judgment: Judgment normal.     ED Results / Procedures / Treatments   Labs (all labs ordered are listed, but only abnormal results are displayed) Labs Reviewed  BASIC METABOLIC PANEL WITH GFR  CBC  HEPATIC FUNCTION PANEL  POC URINE PREG, ED  TROPONIN I (HIGH SENSITIVITY)    EKG None  Radiology No results found.  Procedures Procedures    Medications Ordered in ED Medications  alum & mag hydroxide-simeth (MAALOX/MYLANTA) 200-200-20 MG/5ML suspension 30 mL (has no administration in time range)    ED Course/ Medical Decision Making/ A&P Clinical Course as of 03/11/24 1455  Sun Mar 11, 2024  1436 EKG: Sinus rhythm, rate of 66, normal PR/QRS interval, normal QTc, no ST/T wave changes, no ischemic changes, no STEMI, normal axis [CR]    Clinical Course User Index [CR] Roselynn Connors, PA-C                                 Medical Decision Making Amount and/or Complexity of Data Reviewed Labs: ordered. Radiology: ordered.  Risk OTC drugs. Prescription drug management.   This patient presents to the ED for concern of chest pain differential diagnosis includes ACS, pericarditis, myocarditis, endocarditis, pneumonia, pneumothorax, hemothorax    Additional history obtained:  Additional history obtained from none External records from outside source obtained and reviewed including medical records   Lab Tests:  I Ordered, and personally interpreted labs.  The pertinent results include: No leukocytosis, no anemia, normal kidney function liver function, normal electrolytes, negative serial troponins, negative D-dimer   Imaging Studies ordered:  I ordered imaging studies including chest x-ray  I independently visualized and interpreted imaging which showed no acute cardiopulmonary process I agree with the radiologist interpretation   Medicines ordered and prescription drug management:  I  ordered medication including GI cocktail for chest pain Reevaluation of the patient after these medicines showed that the patient resolved I have reviewed the patients home medicines and have made adjustments as needed   Problem List / ED Course:  Patient is doing very well at this time and is stable for discharge home.  Patient symptoms have resolved with treatment in the emergency department.  Discussed with patient that all cardiac workup has been unremarkable.  Patient had negative serial troponins and EKG with no acute ischemic changes.  Do not suspect ACS at this time.  Patient had a negative D-dimer and is otherwise low risk via her PERC score and do not suspect pulmonary embolus.  She has no clinical indication for pericarditis or myocarditis as symptoms are not positional in nature.  Do not suspect aortic aneurysm or dissection.  Do not suspect endocarditis in this patient.  Will treat patient for GERD at this point.  Will recommend close follow-up with cardiologist on an outpatient basis as well as her primary care doctor.  Strict turn precautions were discussed for any new or worsening symptoms.  Patient voiced understanding and had no additional questions.   Social Determinants of Health:  None           Final Clinical Impression(s) / ED Diagnoses Final diagnoses:  None    Rx / DC Orders ED Discharge Orders     None         Debra Ramos 03/11/24 1732    Cheyenne Cotta, MD 03/12/24 1141

## 2024-03-11 NOTE — Discharge Instructions (Signed)
 Follow-up closely with your primary care doctor and cardiologist on an outpatient basis.  Return to emergency department immediately for any new or worsening symptoms.

## 2024-03-11 NOTE — ED Triage Notes (Signed)
 Pt c/o left side chest pain that went all the way up her neck and felt through her back. [facility] gave 324 ASA and 0.4 nitroglycerin. 20 G IV to left hand .

## 2024-05-08 ENCOUNTER — Encounter (HOSPITAL_COMMUNITY): Payer: Self-pay

## 2024-08-07 ENCOUNTER — Encounter: Payer: Self-pay | Admitting: *Deleted

## 2024-10-12 ENCOUNTER — Encounter: Payer: Self-pay | Admitting: Adult Health

## 2024-10-12 ENCOUNTER — Ambulatory Visit (INDEPENDENT_AMBULATORY_CARE_PROVIDER_SITE_OTHER): Admitting: Adult Health

## 2024-10-12 VITALS — BP 132/80 | HR 66 | Ht 64.0 in | Wt 239.6 lb

## 2024-10-12 DIAGNOSIS — G935 Compression of brain: Secondary | ICD-10-CM | POA: Diagnosis not present

## 2024-10-12 DIAGNOSIS — R0683 Snoring: Secondary | ICD-10-CM | POA: Diagnosis not present

## 2024-10-12 DIAGNOSIS — I471 Supraventricular tachycardia, unspecified: Secondary | ICD-10-CM | POA: Diagnosis not present

## 2024-10-12 DIAGNOSIS — R400 Somnolence: Secondary | ICD-10-CM | POA: Diagnosis not present

## 2024-10-12 NOTE — Patient Instructions (Signed)
 Set up for home sleep study  Work on  healthy weight loss  Do not drive if sleepy  Use caution with sedating medications  Follow up in 6 weeks [facility]

## 2024-10-12 NOTE — Progress Notes (Signed)
 @Patient  ID: Debra Ramos, female    DOB: [birth year], 43 y.o.   MRN: [id number]  Chief Complaint  Patient presents with   Consult    sleep    Referring provider: No ref. provider found  HPI: 43 year old female seen for sleep consult October 12, 2024 for snoring, restless sleep and daytime sleepiness    TEST/EVENTS : Reviewed 10/12/2024  Discussed the use of AI scribe software for clinical note transcription with the patient, who gave verbal consent to proceed.  History of Present Illness Debra Ramos is a 43 year old female with Chiari malformation who presents with sleep disturbances. She was referred by cardiology for evaluation of potential sleep apnea.  She has been experiencing sleep disturbances characterized by snoring, restless sleep, and daytime sleepiness for approximately a year and a half. She reports waking up during the night and feeling tired upon waking. She has not undergone a sleep study previously and does not use sleep aids.  Typically goes to bed about 10 PM.  Gets up about 7 AM.  Takes up to 40 minutes to go to sleep.  Is up several times throughout the night.  Weight is up about 20 pounds.  Current weight is at 239 pounds with a BMI of 41.  She is working on weight loss with intermittent fasting and is down about 4 pounds in the last week.  Does not use any sleep aids.  Does have daily headaches.  Has no significant caffeine.  She experiences headaches, which she associates with her Chiari malformation, and reports teeth grinding and clenching.  Her medical history includes supraventricular tachycardia (SVT), for which she takes Lopressor  (metoprolol ). She avoids caffeine due to her SVT. Her current medications also include vitamin C, vitamin D, zinc, and a muscle relaxer. She is on an intermittent fasting regimen and has lost four pounds recently.  Her social history reveals that she is a former smoker, consumes alcohol rarely, and has no history of drug use.  She is married with two children, aged 36 and thirteen, and works as a engineer, civil (consulting) on the day shift.  No sleep paralysis or episodes of cataplexy. No personal history of congestive heart failure or stroke.     Allergies  Allergen Reactions   Asa [Aspirin] Other (See Comments)    Migraine headache      There is no immunization history on file for this patient.  Past Medical History:  Diagnosis Date   Bacterial vaginal infection    Kidney infection    SVT (supraventricular tachycardia)    [place] (HCC)    with adenosine    UTI (lower urinary tract infection)     Tobacco History: Social History   Tobacco Use  Smoking Status Former   Types: Cigarettes   Passive exposure: Never  Smokeless Tobacco Never  Tobacco Comments   Smoked once every blue moon   Counseling given: Not Answered Tobacco comments: Smoked once every blue moon   Outpatient Medications Prior to Visit  Medication Sig Dispense Refill   Ascorbic Acid (VITAMIN C PO) Take 1 tablet by mouth daily.     Cholecalciferol (VITAMIN D-3 PO) Take 1 tablet by mouth daily.     methocarbamol (ROBAXIN) 500 MG tablet Take 500 mg by mouth every 8 (eight) hours as needed for muscle spasms.     metoprolol  tartrate (LOPRESSOR ) 25 MG tablet Take 25 mg by mouth 2 (two) times daily. (Patient taking differently: Take 50 mg by mouth 2 (two) times  daily.)     Multiple Vitamins-Minerals (ZINC PO) Take 1 tablet by mouth daily.     naproxen (NAPROSYN) 500 MG tablet Take 500 mg by mouth 2 (two) times daily as needed for moderate pain (pain score 4-6).     omeprazole  (PRILOSEC) 20 MG capsule Take 1 capsule (20 mg total) by mouth daily. (Patient not taking: Reported on 10/12/2024) 30 capsule 0   potassium chloride  (KLOR-CON ) 10 MEQ tablet Take 1 tablet (10 mEq total) by mouth daily for 7 days. (Patient not taking: Reported on 10/12/2024) 7 tablet 0   sucralfate  (CARAFATE ) 1 g tablet Take 1 tablet (1 g total) by mouth 4 (four)  times daily -  with meals and at bedtime. (Patient not taking: Reported on 10/12/2024) 30 tablet 0   tiZANidine (ZANAFLEX) 2 MG tablet Take 2 mg by mouth every 6 (six) hours as needed.     No facility-administered medications prior to visit.     Review of Systems:   Constitutional:   No  weight loss, night sweats,  Fevers, chills, +fatigue, or  lassitude.  HEENT:   No   Difficulty swallowing,  Tooth/dental problems, or  Sore throat,                No sneezing, itching, ear ache, nasal congestion, post nasal drip,   CV:  No chest pain,  Orthopnea, PND, swelling in lower extremities, anasarca, dizziness, palpitations, syncope.   GI  No heartburn, indigestion, abdominal pain, nausea, vomiting, diarrhea, change in bowel habits, loss of appetite, bloody stools.   Resp: No shortness of breath with exertion or at rest.  No excess mucus, no productive cough,  No non-productive cough,  No coughing up of blood.  No change in color of mucus.  No wheezing.  No chest wall deformity  Skin: no rash or lesions.  GU: no dysuria, change in color of urine, no urgency or frequency.  No flank pain, no hematuria   MS:  No joint pain or swelling.  No decreased range of motion.  No back pain.    Physical Exam  BP 132/80   Pulse 66   Ht 5' 4 (1.626 m) Comment: Per pt  Wt 239 lb 9.6 oz (108.7 kg)   SpO2 97% Comment: RA  BMI 41.13 kg/m   GEN: A/Ox3; pleasant , NAD, well nourished    HEENT:  [place]/AT,   NOSE-clear, THROAT-clear, no lesions, no postnasal drip or exudate noted.  Class II MP airway  NECK:  Supple w/ fair ROM; no JVD; normal carotid impulses w/o bruits; no thyromegaly or nodules palpated; no lymphadenopathy.    RESP  Clear  P & A; w/o, wheezes/ rales/ or rhonchi. no accessory muscle use, no dullness to percussion  CARD:  RRR, no m/r/g, no peripheral edema, pulses intact, no cyanosis or clubbing.  GI:   Soft & nt; nml bowel sounds; no organomegaly or masses detected.   Musco: Warm  bil, no deformities or joint swelling noted.   Neuro: alert, no focal deficits noted.    Skin: Warm, no lesions or rashes    Lab Results:Reviewed 10/12/2024   CBC    Component Value Date/Time   WBC 5.0 03/11/2024 1420   RBC 4.48 03/11/2024 1420   HGB 13.9 03/11/2024 1420   HCT 40.4 03/11/2024 1420   PLT 269 03/11/2024 1420   MCV 90.2 03/11/2024 1420   MCH 31.0 03/11/2024 1420   MCHC 34.4 03/11/2024 1420   RDW 12.8 03/11/2024 1420  LYMPHSABS 2.7 02/13/2024 1902   MONOABS 0.6 02/13/2024 1902   EOSABS 0.3 02/13/2024 1902   BASOSABS 0.0 02/13/2024 1902    BMET    Component Value Date/Time   NA 138 03/11/2024 1420   K 3.7 03/11/2024 1420   CL 106 03/11/2024 1420   CO2 22 03/11/2024 1420   GLUCOSE 102 (H) 03/11/2024 1420   BUN 14 03/11/2024 1420   CREATININE 0.69 03/11/2024 1420   CALCIUM 9.1 03/11/2024 1420   GFRNONAA >60 03/11/2024 1420   GFRAA >60 12/07/2016 0936    BNP No results found for: BNP  ProBNP No results found for: PROBNP  Imaging: No results found.  Administration History     None           No data to display          No results found for: NITRICOXIDE     10/12/2024    9:00 AM  Results of the [facility] flowsheet  Sitting and reading 2  Watching TV 2  Sitting, inactive in a public place (e.g. a theatre or a meeting) 0  As a passenger in a car for an hour without a break 0  Lying down to rest in the afternoon when circumstances permit 2  Sitting and talking to someone 0  Sitting quietly after a lunch without alcohol 0  In a car, while stopped for a few minutes in traffic 0  Total score 6        Assessment & Plan:   Assessment and Plan Assessment & Plan Suspected sleep apnea with snoring, restless sleep, daytime sleepiness. She has experienced symptoms of snoring, restless sleep, daytime sleepiness, and gasping during sleep for approximately 1.5 years.. Untreated sleep apnea poses risks of cardiovascular and  neurological complications. Potential treatments include CPAP,  and weight loss, which may improve or resolve sleep apnea. A home sleep study has been ordered through [facility], and a follow-up virtual visit is scheduled in six weeks to discuss the results. Information on sleep apnea and treatment options was provided, and the potential use of CPAP will be based on sleep study results. Weight loss was advised as a potential treatment. - discussed how weight can impact sleep and risk for sleep disordered breathing - discussed options to assist with weight loss: combination of diet modification, cardiovascular and strength training exercises   - had an extensive discussion regarding the adverse health consequences related to untreated sleep disordered breathing - specifically discussed the risks for hypertension, coronary artery disease, cardiac dysrhythmias, cerebrovascular disease, and diabetes - lifestyle modification discussed   - discussed how sleep disruption can increase risk of accidents, particularly when driving - safe driving practices were discussed   BMI 41-healthy weight loss discussed.  Currently working on weight loss with intermittent fasting. Pending sleep study results could be a candidate for Zepbound.  Will discuss on follow-up visit  Chiari malformation-Daily headaches.  Continue follow-up with neurology  History of SVT-appears stable.  Continue follow-up with cardiology Home sleep study pending        Madelin Stank, NP 10/12/2024

## 2024-11-26 ENCOUNTER — Encounter

## 2024-11-26 DIAGNOSIS — R400 Somnolence: Secondary | ICD-10-CM

## 2024-11-26 DIAGNOSIS — R0683 Snoring: Secondary | ICD-10-CM

## 2024-12-06 ENCOUNTER — Telehealth: Payer: Self-pay

## 2024-12-06 NOTE — Telephone Encounter (Signed)
 Copied from CRM #[id number]. Topic: Clinical - Medical Advice >> Dec 06, 2024  8:22 AM Isabell A wrote: Reason for CRM: Patient states she took off her sleep apnea machine on Monday, would like to confirm if results are back in time for her appointment tomorrow.   Callback number: [phone]  Called and spoke to pt. HST results are not yet available. Rescheduled pt for virtual spot with Madelin Stank, NP on Friday, 12/28/24 at 3pm. Pt verbalized understanding, previous appt cancelled. NFN.

## 2024-12-07 ENCOUNTER — Ambulatory Visit: Admitting: Adult Health

## 2024-12-12 ENCOUNTER — Telehealth: Payer: Self-pay | Admitting: Pulmonary Disease

## 2024-12-12 DIAGNOSIS — G4733 Obstructive sleep apnea (adult) (pediatric): Secondary | ICD-10-CM | POA: Diagnosis not present

## 2024-12-12 NOTE — Telephone Encounter (Signed)
 Call patient  Sleep study result  Date of study: 11/26/2024  Impression: Severe obstructive sleep apnea with mild oxygen  desaturations AHI of 75.2, oxygen  nadir of 87%.  Saturations below 88% for 0.5 minutes  Recommendation:  Recommend CPAP therapy for severe obstructive sleep apnea.  Auto-titrating CPAP with pressure settings of 5-15 should be appropriate, along with heated humidification using the patient's preferred mask. Avoid alcohol, sedatives and other CNS depressants that may worsen sleep apnea and disrupt normal sleep architecture. Sleep hygiene should be reviewed to assess factors that may improve sleep quality. Weight management and regular exercise should be initiated or continued  Schedule follow-up in the office 4 to 6 weeks after initiation of treatment

## 2024-12-12 NOTE — Telephone Encounter (Signed)
 I called and spoke to pt. Pt informed of Dr Cathye note and verbalized understanding. Pt agreed to start CPAP therapy and also reschedule her upcoming appt so she has time to use the CPAP. CPAP has been ordered and pt is rescheduled. NFN

## 2024-12-14 ENCOUNTER — Ambulatory Visit: Payer: Self-pay | Admitting: Adult Health

## 2024-12-28 ENCOUNTER — Ambulatory Visit: Admitting: Adult Health

## 2025-01-08 ENCOUNTER — Ambulatory Visit: Admitting: Adult Health
